# Patient Record
Sex: Female | Born: 1945
Health system: Southern US, Community
[De-identification: ages and names within clinical notes are randomized; demographics above are authoritative.]

## PROBLEM LIST (undated history)

## (undated) DIAGNOSIS — Z8679 Personal history of other diseases of the circulatory system: Secondary | ICD-10-CM

## (undated) DIAGNOSIS — B1910 Unspecified viral hepatitis B without hepatic coma: Secondary | ICD-10-CM

## (undated) DIAGNOSIS — A630 Anogenital (venereal) warts: Secondary | ICD-10-CM

## (undated) DIAGNOSIS — S2239XA Fracture of one rib, unspecified side, initial encounter for closed fracture: Secondary | ICD-10-CM

## (undated) DIAGNOSIS — S82899A Other fracture of unspecified lower leg, initial encounter for closed fracture: Secondary | ICD-10-CM

## (undated) DIAGNOSIS — F32A Depression, unspecified: Secondary | ICD-10-CM

## (undated) DIAGNOSIS — N870 Mild cervical dysplasia: Secondary | ICD-10-CM

## (undated) DIAGNOSIS — F419 Anxiety disorder, unspecified: Secondary | ICD-10-CM

## (undated) DIAGNOSIS — F329 Major depressive disorder, single episode, unspecified: Secondary | ICD-10-CM

## (undated) DIAGNOSIS — M797 Fibromyalgia: Secondary | ICD-10-CM

## (undated) HISTORY — DX: Fracture of one rib, unspecified side, initial encounter for closed fracture: S22.39XA

## (undated) HISTORY — DX: Unspecified viral hepatitis B without hepatic coma: B19.10

## (undated) HISTORY — DX: Major depressive disorder, single episode, unspecified: F32.9

## (undated) HISTORY — DX: Mild cervical dysplasia: N87.0

## (undated) HISTORY — DX: Personal history of other diseases of the circulatory system: Z86.79

## (undated) HISTORY — DX: Anogenital (venereal) warts: A63.0

## (undated) HISTORY — PX: LEEP: SHX91

## (undated) HISTORY — PX: TONSILLECTOMY: SUR1361

## (undated) HISTORY — PX: EXPLORATORY LAPAROTOMY: SUR591

## (undated) HISTORY — DX: Fibromyalgia: M79.7

## (undated) HISTORY — DX: Depression, unspecified: F32.A

## (undated) HISTORY — DX: Other fracture of unspecified lower leg, initial encounter for closed fracture: S82.899A

## (undated) HISTORY — DX: Anxiety disorder, unspecified: F41.9

---

## 1978-12-06 HISTORY — PX: BLADDER SURGERY: SHX569

## 1993-12-06 DIAGNOSIS — N870 Mild cervical dysplasia: Secondary | ICD-10-CM

## 1993-12-06 HISTORY — DX: Mild cervical dysplasia: N87.0

## 1998-09-15 ENCOUNTER — Ambulatory Visit (HOSPITAL_COMMUNITY): Admission: RE | Admit: 1998-09-15 | Discharge: 1998-09-15 | Payer: Self-pay | Admitting: Obstetrics and Gynecology

## 1998-09-16 ENCOUNTER — Other Ambulatory Visit: Admission: RE | Admit: 1998-09-16 | Discharge: 1998-09-16 | Payer: Self-pay | Admitting: Obstetrics and Gynecology

## 1999-09-18 ENCOUNTER — Ambulatory Visit (HOSPITAL_COMMUNITY): Admission: RE | Admit: 1999-09-18 | Discharge: 1999-09-18 | Payer: Self-pay | Admitting: Internal Medicine

## 1999-09-18 ENCOUNTER — Encounter: Payer: Self-pay | Admitting: Internal Medicine

## 1999-11-05 ENCOUNTER — Other Ambulatory Visit: Admission: RE | Admit: 1999-11-05 | Discharge: 1999-11-05 | Payer: Self-pay | Admitting: Obstetrics and Gynecology

## 2000-09-07 ENCOUNTER — Encounter: Admission: RE | Admit: 2000-09-07 | Discharge: 2000-10-10 | Payer: Self-pay | Admitting: Internal Medicine

## 2000-10-07 ENCOUNTER — Encounter: Payer: Self-pay | Admitting: Internal Medicine

## 2000-10-07 ENCOUNTER — Ambulatory Visit (HOSPITAL_COMMUNITY): Admission: RE | Admit: 2000-10-07 | Discharge: 2000-10-07 | Payer: Self-pay | Admitting: Internal Medicine

## 2000-12-13 ENCOUNTER — Other Ambulatory Visit: Admission: RE | Admit: 2000-12-13 | Discharge: 2000-12-13 | Payer: Self-pay | Admitting: Obstetrics and Gynecology

## 2002-01-03 ENCOUNTER — Encounter: Admission: RE | Admit: 2002-01-03 | Discharge: 2002-01-03 | Payer: Self-pay | Admitting: Internal Medicine

## 2002-01-03 ENCOUNTER — Encounter: Payer: Self-pay | Admitting: Internal Medicine

## 2002-01-30 ENCOUNTER — Other Ambulatory Visit: Admission: RE | Admit: 2002-01-30 | Discharge: 2002-01-30 | Payer: Self-pay | Admitting: Obstetrics and Gynecology

## 2002-05-08 ENCOUNTER — Ambulatory Visit (HOSPITAL_COMMUNITY): Admission: RE | Admit: 2002-05-08 | Discharge: 2002-05-08 | Payer: Self-pay | Admitting: Internal Medicine

## 2002-05-08 ENCOUNTER — Encounter: Payer: Self-pay | Admitting: Internal Medicine

## 2002-05-15 ENCOUNTER — Encounter: Admission: RE | Admit: 2002-05-15 | Discharge: 2002-05-15 | Payer: Self-pay | Admitting: Internal Medicine

## 2002-05-15 ENCOUNTER — Encounter: Payer: Self-pay | Admitting: Internal Medicine

## 2002-08-10 ENCOUNTER — Ambulatory Visit (HOSPITAL_COMMUNITY): Admission: RE | Admit: 2002-08-10 | Discharge: 2002-08-10 | Payer: Self-pay | Admitting: Gastroenterology

## 2002-12-06 HISTORY — PX: HYSTEROSCOPY: SHX211

## 2003-02-12 ENCOUNTER — Other Ambulatory Visit: Admission: RE | Admit: 2003-02-12 | Discharge: 2003-02-12 | Payer: Self-pay | Admitting: Obstetrics and Gynecology

## 2003-04-23 ENCOUNTER — Encounter: Payer: Self-pay | Admitting: Internal Medicine

## 2003-04-23 ENCOUNTER — Encounter: Admission: RE | Admit: 2003-04-23 | Discharge: 2003-04-23 | Payer: Self-pay | Admitting: Internal Medicine

## 2003-05-09 ENCOUNTER — Ambulatory Visit (HOSPITAL_COMMUNITY): Admission: RE | Admit: 2003-05-09 | Discharge: 2003-05-09 | Payer: Self-pay | Admitting: Obstetrics and Gynecology

## 2003-05-09 ENCOUNTER — Encounter (INDEPENDENT_AMBULATORY_CARE_PROVIDER_SITE_OTHER): Payer: Self-pay

## 2003-05-17 ENCOUNTER — Encounter: Admission: RE | Admit: 2003-05-17 | Discharge: 2003-05-17 | Payer: Self-pay | Admitting: Occupational Medicine

## 2003-05-17 ENCOUNTER — Encounter: Payer: Self-pay | Admitting: Occupational Medicine

## 2003-06-06 ENCOUNTER — Encounter: Admission: RE | Admit: 2003-06-06 | Discharge: 2003-06-06 | Payer: Self-pay | Admitting: Occupational Medicine

## 2003-06-06 ENCOUNTER — Encounter: Payer: Self-pay | Admitting: Occupational Medicine

## 2003-07-12 ENCOUNTER — Encounter: Payer: Self-pay | Admitting: *Deleted

## 2003-07-12 ENCOUNTER — Encounter: Admission: RE | Admit: 2003-07-12 | Discharge: 2003-07-12 | Payer: Self-pay | Admitting: *Deleted

## 2003-08-16 ENCOUNTER — Ambulatory Visit (HOSPITAL_COMMUNITY): Admission: RE | Admit: 2003-08-16 | Discharge: 2003-08-16 | Payer: Self-pay | Admitting: Obstetrics and Gynecology

## 2003-08-16 ENCOUNTER — Encounter: Payer: Self-pay | Admitting: Obstetrics and Gynecology

## 2004-03-27 ENCOUNTER — Other Ambulatory Visit: Admission: RE | Admit: 2004-03-27 | Discharge: 2004-03-27 | Payer: Self-pay | Admitting: Obstetrics and Gynecology

## 2004-08-18 ENCOUNTER — Ambulatory Visit (HOSPITAL_COMMUNITY): Admission: RE | Admit: 2004-08-18 | Discharge: 2004-08-18 | Payer: Self-pay | Admitting: Obstetrics and Gynecology

## 2004-09-23 ENCOUNTER — Emergency Department (HOSPITAL_COMMUNITY): Admission: EM | Admit: 2004-09-23 | Discharge: 2004-09-23 | Payer: Self-pay | Admitting: Emergency Medicine

## 2005-03-31 ENCOUNTER — Other Ambulatory Visit: Admission: RE | Admit: 2005-03-31 | Discharge: 2005-03-31 | Payer: Self-pay | Admitting: Obstetrics and Gynecology

## 2005-08-18 ENCOUNTER — Ambulatory Visit (HOSPITAL_COMMUNITY): Admission: RE | Admit: 2005-08-18 | Discharge: 2005-08-18 | Payer: Self-pay | Admitting: Obstetrics and Gynecology

## 2006-04-20 ENCOUNTER — Other Ambulatory Visit: Admission: RE | Admit: 2006-04-20 | Discharge: 2006-04-20 | Payer: Self-pay | Admitting: Obstetrics and Gynecology

## 2006-08-23 ENCOUNTER — Ambulatory Visit (HOSPITAL_COMMUNITY): Admission: RE | Admit: 2006-08-23 | Discharge: 2006-08-23 | Payer: Self-pay | Admitting: Obstetrics and Gynecology

## 2006-08-26 ENCOUNTER — Ambulatory Visit (HOSPITAL_COMMUNITY): Admission: RE | Admit: 2006-08-26 | Discharge: 2006-08-26 | Payer: Self-pay | Admitting: Obstetrics and Gynecology

## 2007-07-28 ENCOUNTER — Other Ambulatory Visit: Admission: RE | Admit: 2007-07-28 | Discharge: 2007-07-28 | Payer: Self-pay | Admitting: Obstetrics and Gynecology

## 2007-08-25 ENCOUNTER — Ambulatory Visit (HOSPITAL_COMMUNITY): Admission: RE | Admit: 2007-08-25 | Discharge: 2007-08-25 | Payer: Self-pay | Admitting: Obstetrics and Gynecology

## 2008-09-23 ENCOUNTER — Ambulatory Visit (HOSPITAL_COMMUNITY): Admission: RE | Admit: 2008-09-23 | Discharge: 2008-09-23 | Payer: Self-pay | Admitting: Obstetrics and Gynecology

## 2008-12-25 ENCOUNTER — Other Ambulatory Visit: Admission: RE | Admit: 2008-12-25 | Discharge: 2008-12-25 | Payer: Self-pay | Admitting: Obstetrics and Gynecology

## 2009-03-14 ENCOUNTER — Emergency Department (HOSPITAL_COMMUNITY): Admission: EM | Admit: 2009-03-14 | Discharge: 2009-03-14 | Payer: Self-pay | Admitting: Emergency Medicine

## 2009-05-13 ENCOUNTER — Encounter
Admission: RE | Admit: 2009-05-13 | Discharge: 2009-05-13 | Payer: Self-pay | Admitting: Physical Medicine and Rehabilitation

## 2009-10-22 ENCOUNTER — Ambulatory Visit (HOSPITAL_COMMUNITY): Admission: RE | Admit: 2009-10-22 | Discharge: 2009-10-22 | Payer: Self-pay | Admitting: Obstetrics and Gynecology

## 2010-10-26 ENCOUNTER — Ambulatory Visit (HOSPITAL_COMMUNITY)
Admission: RE | Admit: 2010-10-26 | Discharge: 2010-10-26 | Payer: Self-pay | Source: Home / Self Care | Admitting: Obstetrics and Gynecology

## 2011-04-23 NOTE — Op Note (Signed)
   Anna Lindsey, Anna Lindsey                      ACCOUNT NO.:  [id number]   MEDICAL RECORD NO.:  [id number]                   PATIENT TYPE:  AMB   LOCATION:  ENDO                                 FACILITY:  [facility]   PHYSICIAN:  James L. Malon Kindle., M.D.          DATE OF BIRTH:  [birth year]   DATE OF PROCEDURE:  DATE OF DISCHARGE:                                 OPERATIVE REPORT   PROCEDURE:  Colonoscopy, scope Olympus pediatric video colonoscope.   MEDICATIONS:  Fentanyl 70 mg, Versed 7 mg IV.   INDICATIONS:  Strong family history of colon cancer.   HISTORY:  The patient comes in now for a five year followup colonoscopy. She  had a negative colonoscopy five years ago. She has a strong family history  of colon cancer.   DESCRIPTION OF PROCEDURE:  The procedure was explained to the patient and  the patient's consent was obtained.  The patient was placed  in the left  lateral decubitus position.   The Olympus pediatric scope  was inserted and advanced under direct  visualization. The prep was excellent. Having  reached the cecum without  difficulty using abdominal pressure and position changes, the ileocecal  valve and the appendiceal orifice were seen.   The scope was withdrawn and the cecum, ascending colon, hepatic flexure,  transverse colon, splenic flexure, descending and sigmoid  colon were seen  well. No polyps or other lesions were seen. The scope was withdrawn. The  patient tolerated the procedure well.   ASSESSMENT:  No evidence of colon polyps in this high risk individual.   PLAN:  Will recommend yearly hemoccults and repeat  her colonoscopy in five  years.                                                 James L. Malon Kindle., M.D.    Anna Lindsey  D:  08/10/2002  T:  08/13/2002  Job:  [id number]   cc:   Darius Bump, M.D.

## 2011-04-23 NOTE — Op Note (Signed)
   [name], Anna Lindsey                      ACCOUNT NO.:  [id number]   MEDICAL RECORD NO.:  [id number]                   PATIENT TYPE:  AMB   LOCATION:  SDC                                  FACILITY:  [facility]   PHYSICIAN:  Cynthia P. Romine, M.D.             DATE OF BIRTH:  [birth year]   DATE OF PROCEDURE:  05/09/2003  DATE OF DISCHARGE:                                 OPERATIVE REPORT   PREOPERATIVE DIAGNOSES:  1. Postmenopausal bleeding.  2. Known endometrial polyp.   POSTOPERATIVE DIAGNOSES:  1. Postmenopausal bleeding.  2. Known endometrial polyp.  3. Pathology pending.   PROCEDURES:  1. Hysteroscopy.  2. Dilatation and curettage.  3. Removal of endometrial polyp.   SURGEON:  Cynthia P. Romine, M.D.   ANESTHESIA:  Modified anesthesia care and paracervical block.   ESTIMATED BLOOD LOSS:  Minimal.   COMPLICATIONS:  None.   PROCEDURE:  The patient was taken to the operating room, where after the  induction of IV sedation by anesthesia was placed in the dorsal lithotomy  position and prepped and draped in the usual fashion.  The cervix was  grasped on the anterior lip with a single-tooth tenaculum.  The uterus was  sounded to 8 cm.  The cervix was dilated to a #31 Pratt.  Polyp forceps were  introduced and a large approximately  10 cm polyp was removed intact with  polyp forceps.  The resectoscope was then introduced, sorbitol was used as a  distention medium.  The cavity was inspected and other than that polyp,  which had been there previously, was absolutely clean.  The tubal ostia were  noted.  Photographic documentation was taken.  The hysteroscope was removed,  sharp curettage was carried out with a scant specimen and sent to pathology,  and the procedure was terminated.  The instruments were removed from the  patient's vagina and the patient was taken to the recovery room in  satisfactory condition.                                                Cynthia P.  Romine, M.D.    CPR/MEDQ  D:  05/09/2003  T:  05/09/2003  Job:  [id number]

## 2011-08-10 ENCOUNTER — Ambulatory Visit (HOSPITAL_COMMUNITY): Payer: Self-pay | Admitting: Psychology

## 2011-08-17 ENCOUNTER — Ambulatory Visit (HOSPITAL_COMMUNITY): Payer: Self-pay | Admitting: Psychology

## 2011-09-03 ENCOUNTER — Ambulatory Visit (HOSPITAL_COMMUNITY): Payer: 59 | Admitting: Psychology

## 2011-09-03 DIAGNOSIS — F3289 Other specified depressive episodes: Secondary | ICD-10-CM

## 2011-09-03 DIAGNOSIS — F329 Major depressive disorder, single episode, unspecified: Secondary | ICD-10-CM

## 2011-09-21 ENCOUNTER — Encounter (HOSPITAL_COMMUNITY): Payer: 59 | Admitting: Psychology

## 2011-09-21 DIAGNOSIS — F331 Major depressive disorder, recurrent, moderate: Secondary | ICD-10-CM

## 2011-10-07 ENCOUNTER — Encounter (HOSPITAL_COMMUNITY): Payer: 59 | Admitting: Psychology

## 2011-10-07 ENCOUNTER — Encounter (HOSPITAL_COMMUNITY): Payer: Self-pay | Admitting: Psychology

## 2011-10-07 DIAGNOSIS — F331 Major depressive disorder, recurrent, moderate: Secondary | ICD-10-CM

## 2011-10-07 NOTE — Progress Notes (Signed)
Anna Lindsey is experiencing extreme fatigue and finds it hard to know if it is from physical work at work or from emotional stress.  New person at work has started, but looks to [place] to train her.  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 In addition to caring for her mother (she has not found time to call and request home health visit), she now is also giving fluids by clysis to her mother's cat 3 times a week.  He is ill and old, but she is not yet ready to consider euthanasia.  Anna Lindsey appears tired, speaks in a normal tone and volume and with good insight and judgment.  She is able to acknowledge her current situation is temporary, so although she feels "trapped" we were able to identify a good prospect of relief within several months.  Major intervention was reframing and accepting the present situation.

## 2011-10-07 NOTE — Patient Instructions (Signed)
Return in 2 weeks

## 2011-12-10 ENCOUNTER — Ambulatory Visit (HOSPITAL_COMMUNITY): Payer: 59 | Admitting: Psychology

## 2011-12-10 DIAGNOSIS — IMO0002 Reserved for concepts with insufficient information to code with codable children: Secondary | ICD-10-CM

## 2011-12-10 NOTE — Progress Notes (Signed)
   THERAPIST PROGRESS NOTE  Session Time: 1600 - 1700  Participation Level: Active  Behavioral Response: Well GroomedAlertDepressed  Type of Therapy: Individual Therapy  Treatment Goals addressed: Coping  Interventions: Supportive and Reframing  Summary: Anna Lindsey is a 66 y.o. female who presents with extreme tiredness related to working  3/4 time and having care taking responsibilities for her mother and to a degree for her husband and daughter.  The holidays were difficult, with work requirements and her mother needing hospitalization for a URI on Christmas Day.  We focused on some of the current demands on her time while looking for ways to relieve her sense that everything is up to her.  She has spoken to her husband about learning to drive (he has never tried to get a license and is 65 years old, had poor eyesight as a child).  Her daughter is remaining clean and sober and may complete nursing school this semester. Anna Lindsey has cut down to 6 hrs/day at work and the person who was hired to be the supervisor needs less help from her now.  She spends each night at her mother's house and has a  Public affairs consultant during the day, but her mother is improving and may not need that continuously. Anna Lindsey is working on her attitude that she needs to be sure everybody else is ok, and give herself a break at times.  Overall, she reports she is less angry now and is focused on taking care of her own needs and well being. Affect is broad, mood "tired", thinking logical and goal directed and she can sleep at night.   Suicidal/Homicidal: Nowithout intent/plan  Therapist Response:   shared care-giving tips, validated her decisions thus far and supported her self care efforts.  Suggested when she finds herself getting irritable that she step back and ask herself what is bothering her and is it something she can do something about or not.   Plan: Return again in 4 weeks.  Diagnosis: Axis I: Major Depression,  Recurrent moderate    Axis II: No diagnosis    Elease Swarm, RN 12/10/2011

## 2011-12-11 ENCOUNTER — Emergency Department (HOSPITAL_COMMUNITY): Payer: No Typology Code available for payment source

## 2011-12-11 ENCOUNTER — Emergency Department (HOSPITAL_COMMUNITY)
Admission: EM | Admit: 2011-12-11 | Discharge: 2011-12-12 | Disposition: A | Discharge status: Home / Self Care | Payer: No Typology Code available for payment source | Attending: Emergency Medicine | Admitting: Emergency Medicine

## 2011-12-11 ENCOUNTER — Encounter: Payer: Self-pay | Admitting: *Deleted

## 2011-12-11 DIAGNOSIS — R609 Edema, unspecified: Secondary | ICD-10-CM | POA: Insufficient documentation

## 2011-12-11 DIAGNOSIS — Z79899 Other long term (current) drug therapy: Secondary | ICD-10-CM | POA: Insufficient documentation

## 2011-12-11 DIAGNOSIS — S92309A Fracture of unspecified metatarsal bone(s), unspecified foot, initial encounter for closed fracture: Secondary | ICD-10-CM

## 2011-12-11 DIAGNOSIS — M79609 Pain in unspecified limb: Secondary | ICD-10-CM | POA: Insufficient documentation

## 2011-12-11 DIAGNOSIS — M7989 Other specified soft tissue disorders: Secondary | ICD-10-CM | POA: Insufficient documentation

## 2011-12-11 DIAGNOSIS — S92009A Unspecified fracture of unspecified calcaneus, initial encounter for closed fracture: Secondary | ICD-10-CM

## 2011-12-11 DIAGNOSIS — S9030XA Contusion of unspecified foot, initial encounter: Secondary | ICD-10-CM | POA: Insufficient documentation

## 2011-12-11 MED ORDER — HYDROCODONE-ACETAMINOPHEN 5-325 MG PO TABS
2.0000 | ORAL_TABLET | Freq: Once | ORAL | Status: AC
  Administered 2011-12-11: 2 via ORAL
  Filled 2011-12-11: qty 2
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
## 2011-12-12 ENCOUNTER — Emergency Department (HOSPITAL_COMMUNITY): Payer: No Typology Code available for payment source

## 2011-12-12 MED ORDER — OXYCODONE-ACETAMINOPHEN 5-325 MG PO TABS: 1.0000 | ORAL_TABLET | ORAL | Status: AC | PRN

## 2011-12-12 MED ORDER — ONDANSETRON HCL 4 MG PO TABS: 4.0000 mg | ORAL_TABLET | Freq: Four times a day (QID) | ORAL | Status: AC

## 2012-01-07 ENCOUNTER — Ambulatory Visit (HOSPITAL_COMMUNITY): Payer: 59 | Admitting: Psychology

## 2012-04-19 ENCOUNTER — Other Ambulatory Visit: Payer: Self-pay | Admitting: Obstetrics and Gynecology

## 2012-04-19 DIAGNOSIS — Z1231 Encounter for screening mammogram for malignant neoplasm of breast: Secondary | ICD-10-CM

## 2012-04-25 ENCOUNTER — Ambulatory Visit
Admission: RE | Admit: 2012-04-25 | Discharge: 2012-04-25 | Disposition: A | Discharge status: Home / Self Care | Payer: Medicare Other | Source: Ambulatory Visit | Attending: Obstetrics and Gynecology | Admitting: Obstetrics and Gynecology

## 2012-04-25 DIAGNOSIS — Z1231 Encounter for screening mammogram for malignant neoplasm of breast: Secondary | ICD-10-CM

## 2012-07-12 ENCOUNTER — Ambulatory Visit (INDEPENDENT_AMBULATORY_CARE_PROVIDER_SITE_OTHER): Payer: Medicare Other | Admitting: Family Medicine

## 2012-07-12 VITALS — BP 130/75 | HR 87 | Temp 97.7°F | Resp 16 | Ht 61.0 in | Wt 111.0 lb

## 2012-07-12 DIAGNOSIS — R059 Cough, unspecified: Secondary | ICD-10-CM

## 2012-07-12 DIAGNOSIS — W5501XA Bitten by cat, initial encounter: Secondary | ICD-10-CM

## 2012-07-12 DIAGNOSIS — R05 Cough: Secondary | ICD-10-CM

## 2012-07-12 MED ORDER — AMOXICILLIN-POT CLAVULANATE 875-125 MG PO TABS: 1.0000 | ORAL_TABLET | Freq: Two times a day (BID) | ORAL | Status: AC

## 2012-07-12 NOTE — Progress Notes (Signed)
[facility] [address] [place] [address] [phone]  Date:  07/12/2012   Name:  Anna Lindsey   DOB:  1946/04/11   MRN:  [id number]  PCP:  No primary provider on file.    Chief Complaint: wound forearm   History of Present Illness:  Anna Lindsey is a 66 y.o. very pleasant female patient who presents with the following:  She was bitten by her elderly cat last night- bitten on her right arm. She was bathing the cat- it became upset and bit her.  The cat is up to date on it's immunizations and is an indoor pet.  The bite turned red and swollen over-night, and is painful.  She also has noted a mild cough for about one week.  She has not had any fever or chills, and is not coughing anything up.    Tetanus shot in 2007  There is no problem list on file for this patient.   No past medical history on file.  No past surgical history on file.  History  Substance Use Topics  . Smoking status: Never Smoker   . Smokeless tobacco: Not on file  . Alcohol Use: No    No family history on file.  No Known Allergies  Medication list has been reviewed and updated.  Current Outpatient Prescriptions on File Prior to Visit  Medication Sig Dispense Refill  . citalopram (CELEXA) 20 MG tablet Take 20 mg by mouth at bedtime.        Marland Kitchen estradiol (ESTRACE) 1 MG tablet Take 1 mg by mouth daily.        . medroxyPROGESTERone (PROVERA) 5 MG tablet Take 5 mg by mouth daily. TAKE ON DAYS 1-13       . nortriptyline (PAMELOR) 75 MG capsule Take 75 mg by mouth at bedtime.          Review of Systems:  As per HPI- otherwise negative.   Physical Examination: Filed Vitals:   07/12/12 0954  BP: 130/75  Pulse: 87  Temp: 97.7 F (36.5 C)  Resp: 16   Filed Vitals:   07/12/12 0954  Height: 5\' 1"  (1.549 m)  Weight: 111 lb (50.349 kg)   Body mass index is 20.97 kg/(m^2). Ideal Body Weight: Weight in (lb) to have BMI = 25: 132   GEN: WDWN, NAD, Non-toxic, A & O x  3 HEENT: Atraumatic, Normocephalic. Neck supple. No masses, No LAD.  Oropharynx wnl, PEERL Ears and Nose: No external deformity. CV: RRR, No M/G/R. No JVD. No thrill. No extra heart sounds. PULM: CTA B, no wheezes, crackles, rhonchi. No retractions. No resp. distress. No accessory muscle use. ABD: S, NT, ND, +BS. No rebound. No HSM. EXTR: No c/c/e NEURO Normal gait.  PSYCH: Normally interactive. Conversant. Not depressed or anxious appearing.  Calm demeanor.  Right forearm: there are bite marks on the ventral and dorsal sides of her arm with some redness and mild swelling adjacent.  She has full ROM and strength of her arm.  No fluctuance, no pus  Assessment and Plan: 1. Cat bite  amoxicillin-clavulanate (AUGMENTIN) 875-125 MG per tablet  2. Cough     Bite report faxed to [facility]  Augmentin for cat bite.  Cough is likely viral, but augmentin is a good agent for bronchitis as well.  Let me know if bite not improving in the next 48 hours- Sooner if worse.     Abbe Amsterdam, MD

## 2012-07-13 ENCOUNTER — Ambulatory Visit (INDEPENDENT_AMBULATORY_CARE_PROVIDER_SITE_OTHER): Payer: Medicare Other | Admitting: Family Medicine

## 2012-07-13 ENCOUNTER — Ambulatory Visit: Payer: Medicare Other

## 2012-07-13 VITALS — BP 134/71 | HR 97 | Temp 97.8°F | Resp 18 | Ht 60.5 in | Wt 112.0 lb

## 2012-07-13 DIAGNOSIS — W5501XA Bitten by cat, initial encounter: Secondary | ICD-10-CM

## 2012-07-13 DIAGNOSIS — R059 Cough, unspecified: Secondary | ICD-10-CM

## 2012-07-13 DIAGNOSIS — R05 Cough: Secondary | ICD-10-CM

## 2012-07-13 DIAGNOSIS — IMO0002 Reserved for concepts with insufficient information to code with codable children: Secondary | ICD-10-CM

## 2012-07-13 LAB — POCT CBC
Lymph, poc: 2.2 (ref 0.6–3.4)
MCH, POC: 29.6 pg (ref 27–31.2)
MCHC: 30.2 g/dL — AB (ref 31.8–35.4)
MID (cbc): 0.6 (ref 0–0.9)
MPV: 7.8 fL (ref 0–99.8)
POC LYMPH PERCENT: 25.3 %L (ref 10–50)
POC MID %: 6.6 %M (ref 0–12)
Platelet Count, POC: 465 10*3/uL — AB (ref 142–424)
WBC: 8.6 10*3/uL (ref 4.6–10.2)

## 2012-07-13 MED ORDER — CEFTRIAXONE SODIUM 1 G IJ SOLR
1.0000 g | INTRAMUSCULAR | Status: DC
  Administered 2012-07-13: 1 g via INTRAMUSCULAR

## 2012-07-13 NOTE — Progress Notes (Signed)
[facility] [address] [place] [address] [phone]  Date:  07/13/2012   Name:  Anna Lindsey   DOB:  11-15-1946   MRN:  [id number]  PCP:  No primary provider on file.    Chief Complaint: Animal Bite   History of Present Illness:  Anna Lindsey is a 66 y.o. very pleasant female patient who presents with the following:  She was here yesterday to evaluate a cat bite on her right arm- she had taken 3 doses of augmentin and is not better yet.  In fact, the redness is spreading and the pain is worse.  No fever.  She was concerned and came in for recheck.  She is generally very healthy  There is no problem list on file for this patient.   No past medical history on file.  No past surgical history on file.  History  Substance Use Topics  . Smoking status: Never Smoker   . Smokeless tobacco: Not on file  . Alcohol Use: No    No family history on file.  No Known Allergies  Medication list has been reviewed and updated.  Current Outpatient Prescriptions on File Prior to Visit  Medication Sig Dispense Refill  . amoxicillin-clavulanate (AUGMENTIN) 875-125 MG per tablet Take 1 tablet by mouth 2 (two) times daily.  20 tablet  0  . citalopram (CELEXA) 20 MG tablet Take 20 mg by mouth at bedtime.        Marland Kitchen estradiol (ESTRACE) 1 MG tablet Take 1 mg by mouth daily.        . medroxyPROGESTERone (PROVERA) 5 MG tablet Take 5 mg by mouth daily. TAKE ON DAYS 1-13       . nortriptyline (PAMELOR) 75 MG capsule Take 75 mg by mouth at bedtime.          Review of Systems:  As per HPI- otherwise negative. She feels ok except for her arm  Physical Examination: Filed Vitals:   07/13/12 1536  BP: 134/71  Pulse: 97  Temp: 97.8 F (36.6 C)  Resp: 18   Filed Vitals:   07/13/12 1536  Height: 5' 0.5" (1.537 m)  Weight: 112 lb (50.803 kg)   Body mass index is 21.51 kg/(m^2). Ideal Body Weight: Weight in (lb) to have BMI = 25: 129.9   GEN: WDWN, NAD,  Non-toxic, A & O x 3, looks well HEENT: Atraumatic, Normocephalic. Neck supple. No masses, No LAD. Ears and Nose: No external deformity. CV: RRR, No M/G/R. No JVD. No thrill. No extra heart sounds. PULM: CTA B, no wheezes, crackles, rhonchi. No retractions. EXTR: No c/c/e. Right forearm- again noted bite marks on the ventral and dorsal sides, with slight warmth and redness.  Mild swelling.  The redness is more widespread than yesterday.  No purulence or fluctuance.   NEURO Normal gait.  PSYCH: Normally interactive. Conversant. Not depressed or anxious appearing.  Calm demeanor.   [facility] reading (PRIMARY) by  Dr. Patsy Lager.  Right forearm:  Negative  Results for orders placed in visit on 07/13/12  POCT CBC      Component Value Range   WBC 8.6  4.6 - 10.2 K/uL   Lymph, poc 2.2  0.6 - 3.4   POC LYMPH PERCENT 25.3  10 - 50 %L   MID (cbc) 0.6  0 - 0.9   POC MID % 6.6  0 - 12 %M   POC Granulocyte 5.9  2 - 6.9   Granulocyte percent  68.1  37 - 80 %G   RBC 4.25  4.04 - 5.48 M/uL   Hemoglobin 12.6  12.2 - 16.2 g/dL   HCT, POC 16.1  09.6 - 47.9 %   MCV 98.1 (*) 80 - 97 fL   MCH, POC 29.6  27 - 31.2 pg   MCHC 30.2 (*) 31.8 - 35.4 g/dL   RDW, POC 045     Platelet Count, POC 465 (*) 142 - 424 K/uL   MPV 7.8  0 - 99.8 fL    Assessment and Plan: 1. Cat bite  POCT CBC, DG Forearm Right, cefTRIAXone (ROCEPHIN) injection 1 g   Cat bite- infected.  Not yet responding to treatment.  Add a gram of rocephin today, continue to use augmentin.  Come back tomorrow if continuing to get worse.  Gave her culture tube in case she develops any pus.  Call if not getting better or if any other concerns!  Abbe Amsterdam, MD

## 2012-07-14 ENCOUNTER — Telehealth: Payer: Self-pay

## 2012-07-14 NOTE — Telephone Encounter (Signed)
Saw Dr. Patsy Lager yesterday and had a cat bite from her own animal.  Animal control in [place] wants to take the cat.  Anna Lindsey (pt's middle name) wants to know if there are any Rabies shots that she can take that would mitigate animal control taking possession of her cat.   [phone].

## 2012-07-15 NOTE — Telephone Encounter (Signed)
She can certainly try to show immunizations to animal control, but unsure what their regulations are.

## 2012-07-15 NOTE — Telephone Encounter (Signed)
As long as her Vet can prove that her cat is UTD on vaccines, then can't the vet monitor cat x 1 week instead of animal control?  Otherwise they would have to take animal.

## 2012-07-15 NOTE — Telephone Encounter (Signed)
Called patient, no answer or machine.

## 2012-07-16 NOTE — Telephone Encounter (Signed)
Spoke with patient, cat bite is healing well.  Unfortunately her cat passed away the following day and since she was overdue for her rabies shot, animal control took the cat for testing in [place].  If testing is negative, then they should be able to get the body back to [place]...  So sad.  Patient thanks [place] for checking on her.

## 2012-07-26 ENCOUNTER — Ambulatory Visit (HOSPITAL_COMMUNITY): Payer: Medicare Other | Admitting: Psychiatry

## 2013-01-02 ENCOUNTER — Encounter (HOSPITAL_COMMUNITY): Payer: Self-pay | Admitting: Licensed Clinical Social Worker

## 2013-01-02 ENCOUNTER — Ambulatory Visit (INDEPENDENT_AMBULATORY_CARE_PROVIDER_SITE_OTHER): Payer: Medicare Other | Admitting: Licensed Clinical Social Worker

## 2013-01-02 DIAGNOSIS — F329 Major depressive disorder, single episode, unspecified: Secondary | ICD-10-CM
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
## 2013-01-16 ENCOUNTER — Ambulatory Visit (INDEPENDENT_AMBULATORY_CARE_PROVIDER_SITE_OTHER): Payer: Medicare Other | Admitting: Licensed Clinical Social Worker

## 2013-01-16 DIAGNOSIS — F3289 Other specified depressive episodes: Secondary | ICD-10-CM

## 2013-01-16 DIAGNOSIS — F32A Depression, unspecified: Secondary | ICD-10-CM | POA: Insufficient documentation

## 2013-01-16 DIAGNOSIS — F329 Major depressive disorder, single episode, unspecified: Secondary | ICD-10-CM

## 2013-01-16 NOTE — Progress Notes (Signed)
   THERAPIST PROGRESS NOTE  Session Time: 4:00pm-4:50pm  Participation Level: Active  Behavioral Response: Well GroomedAlertAnxious and Depressed  Type of Therapy: Individual Therapy  Treatment Goals addressed: Coping  Interventions: CBT, Motivational Interviewing, Strength-based, Supportive and Reframing  Summary: Anna Lindsey is a 67 y.o. female who presents with depressed mood and anxious affect. She reports no change in her symptoms since last session. She is unmotivated to do things which need to be done and puts them off. She is tired of taking care of her mother and disappointed that she has to take care of her husband on occasion. She endorses a general feeling of disappointment in her life. She has regrets about choices and is questioning her marriage. She becomes angry with her mother when she does not respond to her and she is unsure if her mother has some type of dementia.    Suicidal/Homicidal: Nowithout intent/plan  Therapist Response: Assessed patients current functioning and reviewed progress. Reviewed coping strategies. Assessed patients safety and assisted in identifying protective factors.  Reviewed crisis plan with patient. Assisted patient with the expression of her feelings of frustration and anger. Used CBT to assist patient with the identification of negative distortions and irrational thoughts. Encouraged patient to verbalize alternative and factual responses which challenge thought distortions. Explored the consequences of constant care taking and cumulative stress. Used DBT to practice mindfulness, review distraction list and improve distress tolerance skills. Reviewed patients self care plan. Assessed  progress related to self care. Patient's self care is good. Recommend proper diet, regular exercise, socialization and recreation. Continue to journal daily.   Plan: Return again in one weeks.  Diagnosis: Axis I: Depressive Disorder NOS    Axis II: No  diagnosis    Mylinda Brook, LCSW 01/16/2013

## 2013-01-17 ENCOUNTER — Ambulatory Visit (INDEPENDENT_AMBULATORY_CARE_PROVIDER_SITE_OTHER): Payer: Medicare Other | Admitting: Psychiatry

## 2013-01-17 ENCOUNTER — Encounter (HOSPITAL_COMMUNITY): Payer: Self-pay | Admitting: Psychiatry

## 2013-01-17 VITALS — BP 128/78 | HR 96 | Wt 118.6 lb

## 2013-01-17 DIAGNOSIS — F329 Major depressive disorder, single episode, unspecified: Secondary | ICD-10-CM

## 2013-01-17 MED ORDER — NORTRIPTYLINE HCL 25 MG PO CAPS: 25.0000 mg | ORAL_CAPSULE | Freq: Every day | ORAL | Status: DC
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
## 2013-01-22 ENCOUNTER — Ambulatory Visit (HOSPITAL_COMMUNITY): Payer: Medicare Other | Admitting: Licensed Clinical Social Worker

## 2013-01-26 ENCOUNTER — Ambulatory Visit (INDEPENDENT_AMBULATORY_CARE_PROVIDER_SITE_OTHER): Payer: Medicare Other | Admitting: Licensed Clinical Social Worker

## 2013-01-26 DIAGNOSIS — F329 Major depressive disorder, single episode, unspecified: Secondary | ICD-10-CM

## 2013-01-26 NOTE — Progress Notes (Signed)
   THERAPIST PROGRESS NOTE  Session Time: 10:30am-11:20am  Participation Level: Active  Behavioral Response: Well GroomedAlertAnxious and Depressed  Type of Therapy: Individual Therapy  Treatment Goals addressed: Coping  Interventions: CBT, Solution Focused, Strength-based, Supportive and Reframing  Summary: Anna Lindsey is a 67 y.o. female who presents with depressed mood and anxious affect. She is pleased to report making some progress on goals set during her last session. She had decreased her visits to her mother's house and has called her instead. She does not endorse feeling guilty over this. She is beginning to realize that her expectations that her mother will "be excited" about something in her life is unrealistic given her age of 57. Patient is able to identify that much of this is about her own fear that she will end up like her mother. She fears her cognition will be compromised as she ages. She is trying to focus on taking care of herself and making changes to her own behavior rather than hoping that her husband or mother will make changes. She expresses frustration with her husband OCD and how this makes them very late. She has decided to stop making plans with him because of this. Her sleep and appetite are wnl.    Suicidal/Homicidal: Nowithout intent/plan  Therapist Response: Assessed patients current functioning and reviewed progress. Reviewed coping strategies. Assessed patients safety and assisted in identifying protective factors.  Reviewed crisis plan with patient. Assisted patient with the expression of her feelings of frustration and fear. Used CBT to assist patient with the identification of negative distortions and irrational thoughts. Encouraged patient to verbalize alternative and factual responses which challenge thought distortions. Reviewed expression of healthy boundaries and assertive communication. Reviewed setting realistic expectations in her life for others in  order to avoid disappointment and frustration. Reviewed patients self care plan. Assessed  progress related to self care. Patient's self care is good. Recommend proper diet, regular exercise, socialization and recreation.   Plan: Return again in one to two weeks.  Diagnosis: Axis I: Depressive Disorder NOS    Axis II: No diagnosis    Abie Killian, LCSW 01/26/2013

## 2013-01-30 ENCOUNTER — Ambulatory Visit (INDEPENDENT_AMBULATORY_CARE_PROVIDER_SITE_OTHER): Payer: Medicare Other | Admitting: Licensed Clinical Social Worker

## 2013-01-30 DIAGNOSIS — F329 Major depressive disorder, single episode, unspecified: Secondary | ICD-10-CM

## 2013-01-30 NOTE — Progress Notes (Signed)
   THERAPIST PROGRESS NOTE  Session Time: 4:00pm-4:50pm  Participation Level: Active  Behavioral Response: Well GroomedAlertAnxious  Type of Therapy: Individual Therapy  Treatment Goals addressed: Coping  Interventions: CBT, Strength-based, Supportive and Reframing  Summary: Anna Lindsey is a 67 y.o. female who presents with depressed mood and anxious affect. She feels like she has turned the corner and endorses hopefulness about her life and ability to make changes. She has become more verbal with her husband, communicating her feelings and needs. She is working towards improved life balance regarding care taking of her mother. She explores her decision making processes and questions the men she chooses in her life. She is able to recognize her pattern of ignoring warning signs in relationships due to her hopefulness. Her sleep is improving. Her appetite is wnl.    Suicidal/Homicidal: Nowithout intent/plan  Therapist Response: Assessed patients current functioning and reviewed progress. Reviewed coping strategies. Assessed patients safety and assisted in identifying protective factors.  Reviewed crisis plan with patient. Assisted patient with the expression of her feelings of frustration. Used CBT to assist patient with the identification of negative distortions and irrational thoughts. Encouraged patient to verbalize alternative and factual responses which challenge thought distortions. Reviewed setting healthy boundaries and assertive communication. Reviewed patients self care plan. Assessed  progress related to self care. Patient's self care is good. Recommend proper diet, regular exercise, socialization and recreation.   Plan: Return again in one weeks.  Diagnosis: Axis I: Depressive Disorder NOS    Axis II: No diagnosis    Makayla Confer, LCSW 01/30/2013

## 2013-02-06 ENCOUNTER — Ambulatory Visit (HOSPITAL_COMMUNITY): Payer: Medicare Other | Admitting: Licensed Clinical Social Worker

## 2013-02-07 ENCOUNTER — Ambulatory Visit (HOSPITAL_COMMUNITY): Payer: Self-pay | Admitting: Psychiatry

## 2013-02-07 ENCOUNTER — Ambulatory Visit (INDEPENDENT_AMBULATORY_CARE_PROVIDER_SITE_OTHER): Payer: Medicare Other | Admitting: Licensed Clinical Social Worker

## 2013-02-07 DIAGNOSIS — F329 Major depressive disorder, single episode, unspecified: Secondary | ICD-10-CM

## 2013-02-07 DIAGNOSIS — F3289 Other specified depressive episodes: Secondary | ICD-10-CM

## 2013-02-07 NOTE — Progress Notes (Signed)
   THERAPIST PROGRESS NOTE  Session Time: 8:30am-9:20am  Participation Level: Active  Behavioral Response: Well GroomedAlertAnxious and Depressed  Type of Therapy: Individual Therapy  Treatment Goals addressed: Coping  Interventions: CBT, Motivational Interviewing, Solution Focused, Strength-based, Supportive and Reframing  Summary: Anna Lindsey is a 67 y.o. female who presents with depressed mood and anxious affect. She reports ongoing frustration with her husband and her mother. She is trying to focus on letting go of her desire and need for control. She feels that she needs to accept that others do not respond the way she does in order to avoid disappointment. She became frustrated with her mother because she refuses to bathe and patient is trying different methods to motivate her. She does still become agitated with her mother, but she is trying to reduce this. She processes her ongoing frustration with her husband, his lack of motivation and accountability in their marriage. She questions why she married him if he is not willing to support her. Her sleep and appetite are wnl.   Suicidal/Homicidal: Nowithout intent/plan  Therapist Response: Assessed patients current functioning and reviewed progress. Reviewed coping strategies. Assessed patients safety and assisted in identifying protective factors.  Reviewed crisis plan with patient. Assisted patient with the expression of frustration and anxiety. Reviewed patients self care plan. Assessed progress related to self care. Patients self care is good. Recommend daily exercise, increased socialization and recreation. Reviewed healthy boundaries and assertive communication. Used CBT to assist patient with the identification of negative distortions and irrational thoughts. Encouraged patient to verbalize alternative and factual responses which challenge thought distortions. Processed and normalized patients grief reaction.   Plan: Return again in  two weeks.  Diagnosis: Axis I: Depressive Disorder NOS    Axis II: No diagnosis    NORDEN,KRISTIN, LCSW 02/07/2013

## 2013-02-08 ENCOUNTER — Encounter (HOSPITAL_COMMUNITY): Payer: Self-pay | Admitting: Psychiatry

## 2013-02-08 ENCOUNTER — Ambulatory Visit (INDEPENDENT_AMBULATORY_CARE_PROVIDER_SITE_OTHER): Payer: Medicare Other | Admitting: Psychiatry

## 2013-02-08 VITALS — BP 146/76 | HR 93 | Wt 118.6 lb

## 2013-02-08 DIAGNOSIS — F329 Major depressive disorder, single episode, unspecified: Secondary | ICD-10-CM

## 2013-02-08 MED ORDER — NORTRIPTYLINE HCL 25 MG PO CAPS: 25.0000 mg | ORAL_CAPSULE | Freq: Every day | ORAL | Status: DC

## 2013-02-08 MED ORDER — CITALOPRAM HYDROBROMIDE 20 MG PO TABS: 20.0000 mg | ORAL_TABLET | Freq: Every day | ORAL | Status: DC
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
## 2013-02-13 ENCOUNTER — Ambulatory Visit (HOSPITAL_COMMUNITY): Payer: Medicare Other | Admitting: Licensed Clinical Social Worker

## 2013-02-21 ENCOUNTER — Telehealth (HOSPITAL_COMMUNITY): Payer: Self-pay | Admitting: *Deleted

## 2013-02-21 DIAGNOSIS — F329 Major depressive disorder, single episode, unspecified: Secondary | ICD-10-CM

## 2013-02-22 MED ORDER — NORTRIPTYLINE HCL 25 MG PO CAPS: 25.0000 mg | ORAL_CAPSULE | Freq: Every day | ORAL | Status: DC

## 2013-02-22 NOTE — Telephone Encounter (Signed)
Per Dr.Arfeen, may give enough Amitriptyline 25 mg for patient to take with Amitriptyline 50 mg (that she already has) to equal a 75 mg dose at bedtime.

## 2013-02-27 ENCOUNTER — Ambulatory Visit (INDEPENDENT_AMBULATORY_CARE_PROVIDER_SITE_OTHER): Payer: Medicare Other | Admitting: Licensed Clinical Social Worker

## 2013-02-27 DIAGNOSIS — F329 Major depressive disorder, single episode, unspecified: Secondary | ICD-10-CM

## 2013-02-27 NOTE — Progress Notes (Signed)
   THERAPIST PROGRESS NOTE  Session Time: 1:00pm-1:50pm  Participation Level: Active  Behavioral Response: Well GroomedAlertDepressed  Type of Therapy: Individual Therapy  Treatment Goals addressed: Coping  Interventions: CBT, DBT, Solution Focused, Strength-based, Supportive and Reframing  Summary: Anna Lindsey is a 67 y.o. female who presents with depressed mood and anxious affect. She reports improvement in her degree of depression and anxiety. She continues to feel agitated with her mother and her lack of response towards patient. Patient wants her mother to acknowledge her sacrifice and to thank her for all she does. She remains confused by her mothers lack of spontaneous response and conversation. She does feel some improvement in her marriage and is pleased that her husband is trying to help more around the home. She is trying to adjust her expectations of her mother, but finds this difficult because she has anger she wants to express, but feels that it is pointless considering her mothers age. She is sleeping and eating well.   Suicidal/Homicidal: Nowithout intent/plan  Therapist Response: Assessed patients current functioning and reviewed progress. Reviewed coping strategies. Assessed patients safety and assisted in identifying protective factors.  Reviewed crisis plan with patient. Assisted patient with the expression of frustration. Reviewed patients self care plan. Assessed progress related to self care. Patients self care is good. Recommend daily exercise, increased socialization and recreation. Reviewed healthy boundaries and assertive communication. Used CBT to assist patient with the identification of negative distortions and irrational thoughts. Encouraged patient to verbalize alternative and factual responses which challenge thought distortions. Used DBT to practice mindfulness, review distraction list and improve distress tolerance skills. Patient feels she is making progress  and wants to return if needed, but will not make a follow up appointment at this time.   Plan: Return again when needed.   Diagnosis: Axis I: Depressive Disorder NOS    Axis II: No diagnosis    Sebastien Jackson, LCSW 02/27/2013

## 2013-03-28 ENCOUNTER — Ambulatory Visit (HOSPITAL_COMMUNITY): Payer: Self-pay | Admitting: Psychiatry

## 2013-03-28 ENCOUNTER — Ambulatory Visit (INDEPENDENT_AMBULATORY_CARE_PROVIDER_SITE_OTHER): Payer: Medicare Other | Admitting: Psychiatry

## 2013-03-28 ENCOUNTER — Encounter (HOSPITAL_COMMUNITY): Payer: Self-pay | Admitting: Psychiatry

## 2013-03-28 VITALS — BP 126/72 | HR 86 | Ht 61.5 in | Wt 116.0 lb

## 2013-03-28 DIAGNOSIS — F329 Major depressive disorder, single episode, unspecified: Secondary | ICD-10-CM

## 2013-03-28 MED ORDER — NORTRIPTYLINE HCL 75 MG PO CAPS: 75.0000 mg | ORAL_CAPSULE | Freq: Every day | ORAL | Status: DC

## 2013-03-28 MED ORDER — NORTRIPTYLINE HCL 25 MG PO CAPS: 25.0000 mg | ORAL_CAPSULE | Freq: Every day | ORAL | Status: DC
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
## 2013-04-16 ENCOUNTER — Other Ambulatory Visit (HOSPITAL_COMMUNITY): Payer: Self-pay | Admitting: Psychiatry

## 2013-04-17 ENCOUNTER — Telehealth (HOSPITAL_COMMUNITY): Payer: Self-pay | Admitting: Psychiatry

## 2013-04-17 ENCOUNTER — Other Ambulatory Visit (HOSPITAL_COMMUNITY): Payer: Self-pay | Admitting: Psychiatry

## 2013-04-17 NOTE — Telephone Encounter (Signed)
Patient complaining of hives with Lamictal.  Recommend to stop and if does not resolve call us back.

## 2013-04-17 NOTE — Telephone Encounter (Signed)
Given script on 03/28/13 with 2 refills

## 2013-04-19 ENCOUNTER — Encounter: Payer: Self-pay | Admitting: Obstetrics and Gynecology

## 2013-04-23 ENCOUNTER — Encounter: Payer: Self-pay | Admitting: Obstetrics and Gynecology

## 2013-04-23 ENCOUNTER — Ambulatory Visit (INDEPENDENT_AMBULATORY_CARE_PROVIDER_SITE_OTHER): Payer: Medicare Other | Admitting: Obstetrics and Gynecology

## 2013-04-23 VITALS — BP 130/70 | Ht 60.5 in | Wt 115.0 lb

## 2013-04-23 DIAGNOSIS — Z01419 Encounter for gynecological examination (general) (routine) without abnormal findings: Secondary | ICD-10-CM

## 2013-04-23 NOTE — Progress Notes (Signed)
67 y.o.   Married    Caucasian   female   [id number]   here for annual exam.  Anxiety and dep is better after seeing MD at [facility] regularly, and he upped her nortriptyline and that has helped.  Is part of a support group for caregivers.  Her mother is 65 yrs old.  Wondering if she can stop her HRT.  Is on it for bone protection primarily.  Patient's last menstrual period was 08/06/2000.          Sexually active: yes  The current method of family planning is post menopausal status.    Exercising: yoga, stretching, 5 minute medatation Last mammogram:  04/25/12 neg Last pap smear:12/30/09 neg History of abnormal pap: 1995 CIN 1-LEEP Smoking:no Alcohol:rarely Last colonoscopy:03/2013 normal, repeat in 5 years Last Bone Density:  01/15/10 fracture risk is considered mildly elevated and may be moderated due to estrogen therapy Last tetanus shot:2006 Last cholesterol check: 2012 slightly elevated  Hgb:   pcp             Urine: pcp   Family History  Problem Relation Age of Onset  . Anxiety disorder Mother   . Depression Father   . Colon cancer Father   . Depression Sister     Patient Active Problem List   Diagnosis Date Noted  . Depressive disorder, not elsewhere classified 01/16/2013    Past Medical History  Diagnosis Date  . Anxiety   . Depression   . Fibromyalgia   . Dysplasia of cervix, low grade (CIN 1) 1995    LEEP  . H/O: rheumatic fever   . Condyloma   . Hepatitis B     with complete resolution- documentation - [facility]    Past Surgical History  Procedure Laterality Date  . Bladder surgery  1980  . Hysteroscopy  2004    D&C Polyp  . Exploratory laparotomy      Due to bladder injury with fall of a horse    Allergies: Ciprofloxacin  Current Outpatient Prescriptions  Medication Sig Dispense Refill  . Calcium 1200-1000 MG-UNIT CHEW Chew by mouth daily.       . citalopram (CELEXA) 20 MG tablet Take 1 tablet (20 mg total) by mouth at bedtime.  30 tablet  0  .  estradiol (ESTRACE) 1 MG tablet Take 1 mg by mouth daily.        . medroxyPROGESTERone (PROVERA) 5 MG tablet Take 5 mg by mouth daily. TAKE ON DAYS 1-13       . nortriptyline (PAMELOR) 75 MG capsule Take 1 capsule (75 mg total) by mouth at bedtime.  30 capsule  2   No current facility-administered medications for this visit.    ROS: Pertinent items are noted in HPI.  Social Hx: Married, one child, retired    Exam:    BP 130/70  Ht 5' 0.5" (1.537 m)  Wt 115 lb (52.164 kg)  BMI 22.08 kg/m2  LMP 08/06/2000   Wt Readings from Last 3 Encounters:  04/23/13 115 lb (52.164 kg)  03/28/13 116 lb (52.617 kg)  02/08/13 118 lb 9.6 oz (53.797 kg)     Ht Readings from Last 3 Encounters:  04/23/13 5' 0.5" (1.537 m)  03/28/13 5' 1.5" (1.562 m)  07/13/12 5' 0.5" (1.537 m)    General appearance: alert, cooperative and appears stated age Head: Normocephalic, without obvious abnormality, atraumatic Neck: no adenopathy, supple, symmetrical, trachea midline and thyroid not enlarged, symmetric, no tenderness/mass/nodules Lungs: clear to  auscultation bilaterally Breasts: Inspection negative, No nipple retraction or dimpling, No nipple discharge or bleeding, No axillary or supraclavicular adenopathy, Normal to palpation without dominant masses Heart: regular rate and rhythm Abdomen: soft, non-tender; bowel sounds normal; no masses,  no organomegaly Extremities: extremities normal, atraumatic, no cyanosis or edema Skin: Skin color, texture, turgor normal. No rashes or lesions Lymph nodes: Cervical, supraclavicular, and axillary nodes normal. No abnormal inguinal nodes palpated Neurologic: Grossly normal   Pelvic: External genitalia:  no lesions              Urethra:  normal appearing urethra with no masses, tenderness or lesions              Bartholins and Skenes: normal                 Vagina: normal appearing vagina with normal color and discharge, no lesions              Cervix: normal  appearance              Pap taken: no        Bimanual Exam:  Uterus:  uterus is normal size, shape, consistency and nontender                                      Adnexa: normal adnexa in size, nontender and no masses                                      Rectovaginal: Confirms                                      Anus:  normal sphincter tone, no lesions  A: normal menopausal exam, on HRT, ok to D/C.  Do BMD now and repeat in 2 years off HRT     Depression/anxiety     Fibromyalgia, h/o Hep B with complete resolution     LEEP for CIN 1 in 1995     H/o post coital UTI, took Trimethoprim 100mg  for prevention     P: mammogram counseled on breast self exam, mammography screening, use and side effects of HRT, adequate intake of calcium and vitamin D, diet and exercise return annually or prn  Stop HRT, and instructed in bone protection measures including exercise and Vit D.      An After Visit Summary was printed and given to the patient.

## 2013-04-23 NOTE — Patient Instructions (Signed)

## 2013-04-25 ENCOUNTER — Telehealth: Payer: Self-pay | Admitting: Obstetrics and Gynecology

## 2013-04-25 NOTE — Telephone Encounter (Signed)
I called patient and rec she stop "cold [place]".

## 2013-04-25 NOTE — Telephone Encounter (Signed)
Patient calling to see how Dr. Tresa Res wants her to stop HRT. Gradually or all at once. Please leave message on CB# of response per patient. Chart in your office.

## 2013-04-25 NOTE — Telephone Encounter (Signed)
Pt has some questions regarding stpping her hormone medication. When do she need to stop this medication

## 2013-05-10 ENCOUNTER — Ambulatory Visit (INDEPENDENT_AMBULATORY_CARE_PROVIDER_SITE_OTHER): Payer: Medicare Other | Admitting: Family Medicine

## 2013-05-10 VITALS — BP 126/72 | HR 79 | Temp 97.6°F | Resp 16 | Ht 60.0 in | Wt 113.0 lb

## 2013-05-10 DIAGNOSIS — Z780 Asymptomatic menopausal state: Secondary | ICD-10-CM

## 2013-05-10 DIAGNOSIS — Z23 Encounter for immunization: Secondary | ICD-10-CM

## 2013-05-10 DIAGNOSIS — Z0000 Encounter for general adult medical examination without abnormal findings: Secondary | ICD-10-CM

## 2013-05-10 LAB — POCT CBC
HCT, POC: 41.4 % (ref 37.7–47.9)
Hemoglobin: 13 g/dL (ref 12.2–16.2)
Lymph, poc: 1.7 (ref 0.6–3.4)
MCH, POC: 31 pg (ref 27–31.2)
MCHC: 31.4 g/dL — AB (ref 31.8–35.4)
POC Granulocyte: 3 (ref 2–6.9)
POC LYMPH PERCENT: 33.8 %L (ref 10–50)
POC MID %: 6.4 %M (ref 0–12)
RDW, POC: 11.9 %
WBC: 5.1 10*3/uL (ref 4.6–10.2)

## 2013-05-10 LAB — COMPREHENSIVE METABOLIC PANEL
Alkaline Phosphatase: 68 U/L (ref 39–117)
CO2: 27 mEq/L (ref 19–32)
Creat: 0.54 mg/dL (ref 0.50–1.10)
Glucose, Bld: 82 mg/dL (ref 70–99)
Sodium: 136 mEq/L (ref 135–145)
Total Bilirubin: 0.6 mg/dL (ref 0.3–1.2)
Total Protein: 7 g/dL (ref 6.0–8.3)

## 2013-05-10 LAB — TSH: TSH: 1.532 u[IU]/mL (ref 0.350–4.500)

## 2013-05-10 LAB — LIPID PANEL
Cholesterol: 204 mg/dL — ABNORMAL HIGH (ref 0–200)
HDL: 54 mg/dL (ref >39)
Total CHOL/HDL Ratio: 3.8 Ratio
Triglycerides: 72 mg/dL (ref <150)
VLDL: 14 mg/dL (ref 0–40)

## 2013-05-10 NOTE — Progress Notes (Signed)
[facility] [address] [place] [address] [phone]  Date:  05/10/2013   Name:  Anna Lindsey   DOB:  [birth year]   MRN:  [id number]  PCP:  Abbe Amsterdam, MD    Chief Complaint: Annual Exam   History of Present Illness:  Anna Lindsey is a 67 y.o. very pleasant female patient who presents with the following:  She is here for a CPE today,  She has an Chief Financial Officer as well, who does her pap.  She has had a pap this year, she will schedule a bone density and mammogram this year.    She uses nortriptyline for FBM- this helps with anxiety and sleep as well.  She does have some ashiness and joint/ muscle pains as part of her FBM She goes to [facility] for her psychogenic medications.   Overall she is doing well, is fasting for labs today. She would like to have a vit D level drawn on the advice of her [facility] "house call" nurse who made a home visit for her.   She recently stopped her HRT per her OBG.    She cares for her 51 year old mother which can be stressful.  She visits her several times a day.  Otherwise she is doing well  She just had a negative colonoscopy.   She had a flu shot this year, and a tetanus shot a few years ago.  She has not yet had her pneumovax or zostavax  Patient Active Problem List   Diagnosis Date Noted  . Depressive disorder, not elsewhere classified 01/16/2013    Past Medical History  Diagnosis Date  . Anxiety   . Depression   . Fibromyalgia   . Dysplasia of cervix, low grade (CIN 1) 1995    LEEP  . H/O: rheumatic fever   . Condyloma   . Hepatitis B     with complete resolution- documentation - [facility]    Past Surgical History  Procedure Laterality Date  . Bladder surgery  1980  . Hysteroscopy  2004    D&C Polyp  . Exploratory laparotomy      Due to bladder injury with fall of a horse  . Tonsillectomy      childhood    History  Substance Use Topics  . Smoking status: Never Smoker   . Smokeless tobacco: Never Used  . Alcohol  Use: 0.5 oz/week    1 drink(s) per week     Comment: rarely drink    Family History  Problem Relation Age of Onset  . Anxiety disorder Mother   . Cancer Mother     skin  . Thyroid disease Mother   . Heart disease Mother     afib  . Depression Father   . Colon cancer Father   . Hypertension Father   . Depression Sister   . Kidney disease Maternal Grandmother   . Parkinson's disease Maternal Grandfather   . Diabetes Paternal Grandmother   . Heart disease Paternal Grandfather     Allergies  Allergen Reactions  . Ciprofloxacin Anxiety    Medication list has been reviewed and updated.  Current Outpatient Prescriptions on File Prior to Visit  Medication Sig Dispense Refill  . Calcium 1200-1000 MG-UNIT CHEW Chew by mouth daily.       . citalopram (CELEXA) 20 MG tablet Take 1 tablet (20 mg total) by mouth at bedtime.  30 tablet  0  . nortriptyline (PAMELOR) 75 MG capsule Take 1  capsule (75 mg total) by mouth at bedtime.  30 capsule  2  . estradiol (ESTRACE) 1 MG tablet Take 1 mg by mouth daily.        . medroxyPROGESTERone (PROVERA) 5 MG tablet Take 5 mg by mouth daily. TAKE ON DAYS 1-13        No current facility-administered medications on file prior to visit.    Review of Systems:  As per HPI- otherwise negative.   Physical Examination: Filed Vitals:   05/10/13 0916  BP: 126/72  Pulse: 79  Temp: 97.6 F (36.4 C)  Resp: 16   Filed Vitals:   05/10/13 0916  Height: 5' (1.524 m)  Weight: 113 lb (51.256 kg)   Body mass index is 22.07 kg/(m^2). Ideal Body Weight: Weight in (lb) to have BMI = 25: 127.7  GEN: WDWN, NAD, Non-toxic, A & O x 3, looks well HEENT: Atraumatic, Normocephalic. Neck supple. No masses, No LAD.  Bilateral TM wnl, oropharynx normal.  PEERL,EOMI.   Ears and Nose: No external deformity. CV: RRR, No M/G/R. No JVD. No thrill. No extra heart sounds. PULM: CTA B, no wheezes, crackles, rhonchi. No retractions. No resp. distress. No accessory muscle  use. ABD: S, NT, ND, +BS. No rebound. No HSM. EXTR: No c/c/e.  Normal strength and DTR all extremities NEURO Normal gait.  PSYCH: Normally interactive. Conversant. Not depressed or anxious appearing.  Calm demeanor.    Assessment and Plan: Physical exam - Plan: POCT CBC, Comprehensive metabolic panel, TSH, Lipid panel, Pneumococcal polysaccharide vaccine 23-valent greater than or equal to 2yo subcutaneous/IM  Post-menopausal - Plan: Vitamin D, 25-hydroxy  Normal PE, she is quite healthy.  Gave pneumovax today, and rx for zostavax.  Will plan further follow- up pending labs.   Signed Abbe Amsterdam, MD

## 2013-05-10 NOTE — Patient Instructions (Addendum)
Please get the shingles vaccine (zostavax) at your convenience.  You received the pneumonia shot today.   I will be in touch regarding your lab results.   Schedule your mammogram and bone density tests as planned  Continue to watch your weight, blood pressure, and exercise levels.  Your blood pressure looks fine today.

## 2013-05-10 NOTE — Progress Notes (Signed)
  Subjective:    Patient ID: Anna Lindsey, female    DOB: 11-06-1946, 67 y.o.   MRN: [id number]  HPI    Review of Systems  Constitutional: Positive for fatigue.  HENT: Positive for sinus pressure.   Eyes: Negative.   Respiratory: Negative.   Cardiovascular: Negative.   Gastrointestinal: Negative.   Endocrine: Negative.   Genitourinary: Negative.   Musculoskeletal: Positive for myalgias and arthralgias.  Skin: Negative.   Allergic/Immunologic: Negative.   Neurological: Negative.   Hematological: Negative.   Psychiatric/Behavioral: Positive for decreased concentration. The patient is nervous/anxious.        Objective:   Physical Exam        Assessment & Plan:

## 2013-05-11 ENCOUNTER — Encounter: Payer: Self-pay | Admitting: Family Medicine

## 2013-06-15 ENCOUNTER — Ambulatory Visit (INDEPENDENT_AMBULATORY_CARE_PROVIDER_SITE_OTHER): Payer: Medicare Other | Admitting: Family Medicine

## 2013-06-15 VITALS — BP 126/66 | HR 85 | Temp 98.1°F | Resp 16 | Ht 61.5 in | Wt 116.4 lb

## 2013-06-15 DIAGNOSIS — L255 Unspecified contact dermatitis due to plants, except food: Secondary | ICD-10-CM

## 2013-06-15 DIAGNOSIS — L237 Allergic contact dermatitis due to plants, except food: Secondary | ICD-10-CM

## 2013-06-15 MED ORDER — PREDNISONE 20 MG PO TABS: ORAL_TABLET | ORAL | Status: DC

## 2013-06-15 MED ORDER — TRIAMCINOLONE ACETONIDE 0.1 % EX CREA: TOPICAL_CREAM | Freq: Two times a day (BID) | CUTANEOUS | Status: DC

## 2013-06-15 NOTE — Progress Notes (Signed)
Subjective: 67 year old lady who has been in good health. She does not have any history of poison ivy problems until now. She was working in her yard about a week ago and then subsequent to that breaking out with a rash from her both forearms. She does not have it on other parts of the body. She's been using 1% hydrocortisone cream on it and taking some antihistamine.  Objective: Moderately extensive erythematous areas on both forearms, below the globe line was spared.  Assessment: Contact dermatitis  Plan: Triamcinolone cream PrednisoneZyrtec

## 2013-06-15 NOTE — Patient Instructions (Signed)
Poison Ivy Poison ivy is a inflammation of the skin (contact dermatitis) caused by touching the allergens on the leaves of the ivy plant following previous exposure to the plant. The rash usually appears 48 hours after exposure. The rash is usually bumps (papules) or blisters (vesicles) in a linear pattern. Depending on your own sensitivity, the rash may simply cause redness and itching, or it may also progress to blisters which may break open. These must be well cared for to prevent secondary bacterial (germ) infection, followed by scarring. Keep any open areas dry, clean, dressed, and covered with an antibacterial ointment if needed. The eyes may also get puffy. The puffiness is worst in the morning and gets better as the day progresses. This dermatitis usually heals without scarring, within 2 to 3 weeks without treatment. HOME CARE INSTRUCTIONS  Thoroughly wash with soap and water as soon as you have been exposed to poison ivy. You have about one half hour to remove the plant resin before it will cause the rash. This washing will destroy the oil or antigen on the skin that is causing, or will cause, the rash. Be sure to wash under your fingernails as any plant resin there will continue to spread the rash. Do not rub skin vigorously when washing affected area. Poison ivy cannot spread if no oil from the plant remains on your body. A rash that has progressed to weeping sores will not spread the rash unless you have not washed thoroughly. It is also important to wash any clothes you have been wearing as these may carry active allergens. The rash will return if you wear the unwashed clothing, even several days later. Avoidance of the plant in the future is the best measure. Poison ivy plant can be recognized by the number of leaves. Generally, poison ivy has three leaves with flowering branches on a single stem. Diphenhydramine may be purchased over the counter and used as needed for itching. Do not drive with  this medication if it makes you drowsy.Ask your caregiver about medication for children. SEEK MEDICAL CARE IF:  Open sores develop.  Redness spreads beyond area of rash.  You notice purulent (pus-like) discharge.  You have increased pain.  Other signs of infection develop (such as fever). Document Released: 11/19/2000 Document Revised: 02/14/2012 Document Reviewed: 10/08/2009 Exit[employer] Patient Information 2014 [employer].  

## 2013-06-22 ENCOUNTER — Other Ambulatory Visit: Payer: Self-pay | Admitting: Obstetrics and Gynecology

## 2013-06-22 DIAGNOSIS — Z1231 Encounter for screening mammogram for malignant neoplasm of breast: Secondary | ICD-10-CM

## 2013-06-27 ENCOUNTER — Encounter (HOSPITAL_COMMUNITY): Payer: Self-pay | Admitting: Psychiatry

## 2013-06-27 ENCOUNTER — Ambulatory Visit (INDEPENDENT_AMBULATORY_CARE_PROVIDER_SITE_OTHER): Payer: Medicare Other | Admitting: Psychiatry

## 2013-06-27 VITALS — BP 137/86 | HR 90

## 2013-06-27 DIAGNOSIS — F329 Major depressive disorder, single episode, unspecified: Secondary | ICD-10-CM

## 2013-06-27 MED ORDER — NORTRIPTYLINE HCL 75 MG PO CAPS: 75.0000 mg | ORAL_CAPSULE | Freq: Every day | ORAL | Status: DC

## 2013-06-27 MED ORDER — CITALOPRAM HYDROBROMIDE 20 MG PO TABS: 20.0000 mg | ORAL_TABLET | Freq: Every day | ORAL | Status: DC
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
## 2013-07-02 ENCOUNTER — Ambulatory Visit (HOSPITAL_COMMUNITY)
Admission: RE | Admit: 2013-07-02 | Discharge: 2013-07-02 | Disposition: A | Discharge status: Home / Self Care | Payer: Medicare Other | Source: Ambulatory Visit | Attending: Obstetrics and Gynecology | Admitting: Obstetrics and Gynecology

## 2013-07-02 DIAGNOSIS — Z1231 Encounter for screening mammogram for malignant neoplasm of breast: Secondary | ICD-10-CM | POA: Insufficient documentation

## 2013-07-16 ENCOUNTER — Telehealth: Payer: Self-pay | Admitting: Obstetrics and Gynecology

## 2013-07-16 NOTE — Telephone Encounter (Signed)
Pt's last BMD was 01-2010. Rec: was to repeat in 2 yrs. Pt had last one at [facility], but that office is now closed. All pt's now go to [place] [facility] on [address]. Order to be signed with paper chart in your cabinet.   Call to pt to advise she is due for BMD. Advised of phone number and address for [facility] and the closing of [facility] where she had her last one done. Advised we would fax over the order and she can call in a day or 2 to schedule appt. Pt appreciative.

## 2013-07-16 NOTE — Telephone Encounter (Signed)
Patient is asking for a referral for bone density.

## 2013-07-30 ENCOUNTER — Telehealth: Payer: Self-pay | Admitting: Gynecology

## 2013-07-30 NOTE — Telephone Encounter (Signed)
Patient returned Carol's call. °

## 2013-07-30 NOTE — Telephone Encounter (Signed)
LMTCB 07/30/13 cm 

## 2013-07-30 NOTE — Telephone Encounter (Signed)
Patient left a message on the answering machine at lunch re: question for nurse. No other details given. Please advise?

## 2013-08-01 NOTE — Telephone Encounter (Signed)
Call to patient to follow up on previous call. LMTCB.

## 2013-08-02 NOTE — Telephone Encounter (Signed)
Pt calling regarding symptoms of menopause.

## 2013-08-02 NOTE — Telephone Encounter (Signed)
Patient states she weaned off HRT after discussing this with Dr Tresa Res.  She did not realize that she would experience these symptoms this much or for this long.  Would like to discuss if this is related to D/C her and if she should restart.  Complains of increased irritability, lack of patience, wieght gain of 5LBS  and "really hot".  Consult scheduled for tomm 08-03-13.

## 2013-08-03 ENCOUNTER — Ambulatory Visit (INDEPENDENT_AMBULATORY_CARE_PROVIDER_SITE_OTHER): Payer: Medicare Other | Admitting: Obstetrics and Gynecology

## 2013-08-03 ENCOUNTER — Encounter: Payer: Self-pay | Admitting: Obstetrics and Gynecology

## 2013-08-03 DIAGNOSIS — N951 Menopausal and female climacteric states: Secondary | ICD-10-CM

## 2013-08-03 MED ORDER — ESTRADIOL 0.05 MG/24HR TD PTTW: 1.0000 | MEDICATED_PATCH | TRANSDERMAL | Status: DC

## 2013-08-03 MED ORDER — PROGESTERONE MICRONIZED 100 MG PO CAPS: 100.0000 mg | ORAL_CAPSULE | Freq: Every day | ORAL | Status: DC

## 2013-08-03 NOTE — Progress Notes (Signed)
67 yo MWF G2P1 quit HRT in May 2014 and now c/o vasomotor sx's, mood instability, wt gain, and pt wondering if she should go back on HRT. Still on celexa but she feels her moods are not stable any more.  We re discussed risks and benefits including breast and ovarian cancer risks, and clotting risk with po.  Pt tried skin patch in past but had a w/d bleed so went back on the pill.  Discussed clot risk with transdermal vs oral.  Pt elects to try the transdermal route.  Rx and instructed in minivelle 0.05 mg and P4 100 mg qd.  Questions answered.

## 2013-08-27 ENCOUNTER — Telehealth: Payer: Self-pay | Admitting: Orthopedic Surgery

## 2013-08-27 NOTE — Telephone Encounter (Signed)
Spoke with pt about insurance co only covering a 30 day supply of Minivelle. Pt would have to pay OOP beyond that. Pt could also pay OOP for estradiol, with the best price being from [facility] around $16. Pt feels so much better on HRT that she will try the estradiol from [facility] and pay OOP.

## 2013-08-29 ENCOUNTER — Telehealth: Payer: Self-pay

## 2013-08-29 DIAGNOSIS — K219 Gastro-esophageal reflux disease without esophagitis: Secondary | ICD-10-CM

## 2013-08-29 NOTE — Telephone Encounter (Signed)
PT REQUESTING REFERRAL FOR CONTINUAL HEARTBURN(DR COPLAND ASKED HER TO CALL FOR THIS REFERRAL)  BEST PHONE FOR PT. [phone]

## 2013-08-29 NOTE — Telephone Encounter (Signed)
Phone message left to advise patient that rx was sent to [facility] on [address] by Dr. Tresa Res on 8/29. Can have rx transferred to [facility] if she chooses. Left message to return call to nurse.

## 2013-08-29 NOTE — Telephone Encounter (Signed)
Mini velle called into pharmacy pt will be completely out of medication this weekend

## 2013-08-29 NOTE — Telephone Encounter (Signed)
Routed to triage 

## 2013-08-30 NOTE — Telephone Encounter (Signed)
Called her back.  She has been having heartburn- off an on for a year. She is also under some stress which can worsen her sx.  She notes sx after eating most foods.    After eating she may notice burning in her chest and throat.  She is most bothered after eating raw onion, green pepper.  She has tried OTC medications including pepcid.  It did not help much She does not have any exertional chest pain

## 2013-08-31 NOTE — Telephone Encounter (Signed)
Dr. Hyacinth Meeker patient of Dr. Tresa Res last exam 07/2013.   I spoke with patient and she would like to dc Minivelle due to cost.  She would like to go back to taking Estradiol (generic) tabs.  If you agree, would like rx sent to [facility] on [address].

## 2013-09-02 MED ORDER — ESTRADIOL 0.5 MG PO TABS: 0.5000 mg | ORAL_TABLET | Freq: Every day | ORAL | Status: DC

## 2013-09-02 NOTE — Telephone Encounter (Signed)
Rx to [facility] done.  Has AEX with DL 1/61.  Please remind her this is the same as the patch just tablets but if she has symptoms on it, please call.  She is to take this and the progsterone 100mg  daily just like with the patch.  Thanks.  MSM

## 2013-09-03 NOTE — Telephone Encounter (Signed)
Patient notified and will pick up rx. Will f/u prn and verbalized understanding of instructions.

## 2013-09-28 ENCOUNTER — Encounter (HOSPITAL_COMMUNITY): Payer: Self-pay | Admitting: Psychiatry

## 2013-09-28 ENCOUNTER — Ambulatory Visit (INDEPENDENT_AMBULATORY_CARE_PROVIDER_SITE_OTHER): Payer: Medicare Other | Admitting: Psychiatry

## 2013-09-28 ENCOUNTER — Encounter (INDEPENDENT_AMBULATORY_CARE_PROVIDER_SITE_OTHER): Payer: Self-pay

## 2013-09-28 VITALS — BP 135/71 | HR 77 | Wt 113.8 lb

## 2013-09-28 DIAGNOSIS — F329 Major depressive disorder, single episode, unspecified: Secondary | ICD-10-CM

## 2013-09-28 MED ORDER — NORTRIPTYLINE HCL 75 MG PO CAPS: 75.0000 mg | ORAL_CAPSULE | Freq: Every day | ORAL | Status: DC

## 2013-09-28 MED ORDER — CITALOPRAM HYDROBROMIDE 20 MG PO TABS: 20.0000 mg | ORAL_TABLET | Freq: Every day | ORAL | Status: DC
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
## 2013-12-26 ENCOUNTER — Encounter: Payer: Self-pay | Admitting: Certified Nurse Midwife

## 2013-12-31 ENCOUNTER — Encounter (INDEPENDENT_AMBULATORY_CARE_PROVIDER_SITE_OTHER): Payer: Self-pay

## 2013-12-31 ENCOUNTER — Encounter (HOSPITAL_COMMUNITY): Payer: Self-pay | Admitting: Psychiatry

## 2013-12-31 ENCOUNTER — Ambulatory Visit (INDEPENDENT_AMBULATORY_CARE_PROVIDER_SITE_OTHER): Payer: Medicare HMO | Admitting: Psychiatry

## 2013-12-31 VITALS — BP 150/70 | HR 90 | Ht 61.5 in | Wt 115.0 lb

## 2013-12-31 DIAGNOSIS — F3289 Other specified depressive episodes: Secondary | ICD-10-CM

## 2013-12-31 DIAGNOSIS — F329 Major depressive disorder, single episode, unspecified: Secondary | ICD-10-CM

## 2013-12-31 MED ORDER — NORTRIPTYLINE HCL 75 MG PO CAPS: 75.0000 mg | ORAL_CAPSULE | Freq: Every day | ORAL | Status: DC

## 2013-12-31 MED ORDER — CITALOPRAM HYDROBROMIDE 20 MG PO TABS: 20.0000 mg | ORAL_TABLET | Freq: Every day | ORAL | Status: DC

## 2013-12-31 NOTE — Progress Notes (Signed)
[facility] Progress Note  Anna Lindsey [id number] 68 y.o.  12/31/2013 2:52 PM  Chief Complaint:  Medication management and followup.  History of Present Illness:  [facility] came in for her follow up appointment She is compliant with her Pamelor and Celexa.   she denied any side effects of medication.  She denies any panic attack, crying spells or any education.  She started [facility] and going there regularly.  She continues to involve in taking Lindsey of her mother who is now more than 67 years old.  She feels her medicines are working very well.   she does not want to change or reduce her medication.  She does not have any tremors or shakes.  She is not drinking or using any illegal substances.  Suicidal Ideation: No Plan Formed: No Patient has means to carry out plan: No  Homicidal Ideation: No Plan Formed: No Patient has means to carry out plan: No  Medical History; Patient has history of tonsillitis, arthritis, chronic headache, fibromyalgia and chronic fatigue.  Her primary [facility] physician is at [facility].   Past Psychiatric History/Hospitalization(s) Patient denies any previous history of psychiatric inpatient treatment or any suicidal attempt.  She started depression in [id number].  She is seeing a therapist in the office since 2012 however currently she is not seeing any therapist.  She did not recall taking any antidepressants in the past until she started seeing psychiatrists in this office.  We have tried Lamictal however patient developed a rash and did not like it.  Patient denies any history of mania, psychosis, hallucinations, poor impulse control, aggression or violence.  She denies any history of ECT treatment.   Review of Systems: Psychiatric: Agitation: No Hallucination: No Depressed Mood: No Insomnia: Yes Hypersomnia: Yes Altered Concentration: Yes Feels Worthless: Yes Grandiose Ideas: Yes Belief In Special Powers: Yes New/Increased Substance  Abuse: No Compulsions: No  Neurologic: Headache: No Seizure: No Paresthesias: No    Outpatient Encounter Prescriptions as of 12/31/2013  Medication Sig  . Calcium 1200-1000 MG-UNIT CHEW Chew by mouth daily.   . citalopram (CELEXA) 20 MG tablet Take 1 tablet (20 mg total) by mouth at bedtime.  Marland Kitchen. estradiol (ESTRACE) 0.5 MG tablet Take 1 tablet (0.5 mg total) by mouth daily.  . Multiple Vitamin (MULTIVITAMIN) tablet Take 1 tablet by mouth daily.  . nortriptyline (PAMELOR) 75 MG capsule Take 1 capsule (75 mg total) by mouth at bedtime.  . progesterone (PROMETRIUM) 100 MG capsule Take 1 capsule (100 mg total) by mouth at bedtime.  . [DISCONTINUED] citalopram (CELEXA) 20 MG tablet Take 1 tablet (20 mg total) by mouth at bedtime.  . [DISCONTINUED] nortriptyline (PAMELOR) 75 MG capsule Take 1 capsule (75 mg total) by mouth at bedtime.    No results found for this or any previous visit (from the past 2160 hour(s)).    Physical Exam: Constitutional:  BP 150/70  Pulse 90  Ht 5' 1.5" (1.562 m)  Wt 115 lb (52.164 kg)  BMI 21.38 kg/m2  LMP 08/06/2000  Musculoskeletal: Strength & Muscle Tone: within normal limits Gait & Station: normal Patient leans: N/A  Mental Status Examination;  Patient is casually dressed and fairly groomed.  She is pleasant and cooperative.  Her speech is clear fluent and coherent.  Her thought process is logical and goal directed.  She described her mood is good and her affect is improved from the past.  She denies any auditory or visual hallucination.  Her fund of knowledge  is adequate.  She denies any active or passive suicidal thoughts or homicidal thoughts.  There are no tremors or shakes.  There were no paranoia or delusion obsession present at this time.  She is alert and oriented x3.  Her insight judgment and impulse control is okay.   Medical Decision Making (Choose Three): Established Problem, Stable/Improving (1), Review of Last Therapy Session (1) and  Review of Medication Regimen & Side Effects (2)  Assessment: Axis I: Depressive disorder NOS  Axis II: Deferred  Axis III:  Past Medical History  Diagnosis Date  . Anxiety   . Depression   . Fibromyalgia   . Dysplasia of cervix, low grade (CIN 1) 1995    LEEP  . H/O: rheumatic fever   . Condyloma   . Hepatitis B     with complete resolution- documentation - [facility]    Axis IV: Mild   Plan:  Patient is  stable on her current psychotropic medication.  I will continue Celexa and Pamelor at present dose.  Recommend to call us back if she feels worsening of the symptoms.  Followup in 3 months.  Anna T., MD 12/31/2013

## 2014-01-02 ENCOUNTER — Telehealth: Payer: Self-pay

## 2014-01-02 NOTE — Telephone Encounter (Signed)
Patient called requesting a call from Dr. Patsy Lageropland. Patient has ordered a Electronics engineer"Tens Machine" device and wants Dr. Patsy Lageropland to give a written approval for the device. Patient wants a call as soon as possible to obtain the device for her back pain. Patient's phone number is [phone].   Thank You very much!!!

## 2014-01-03 NOTE — Telephone Encounter (Signed)
Received order for her TENS unit- completed and faxed today.  Angie M let her knw that this is done

## 2014-04-01 ENCOUNTER — Ambulatory Visit (INDEPENDENT_AMBULATORY_CARE_PROVIDER_SITE_OTHER): Payer: Medicare HMO | Admitting: Psychiatry

## 2014-04-01 ENCOUNTER — Encounter (HOSPITAL_COMMUNITY): Payer: Self-pay | Admitting: Psychiatry

## 2014-04-01 VITALS — BP 150/71 | HR 86 | Ht 61.5 in | Wt 116.0 lb

## 2014-04-01 DIAGNOSIS — F329 Major depressive disorder, single episode, unspecified: Secondary | ICD-10-CM

## 2014-04-01 DIAGNOSIS — F3289 Other specified depressive episodes: Secondary | ICD-10-CM

## 2014-04-01 MED ORDER — CITALOPRAM HYDROBROMIDE 20 MG PO TABS: 20.0000 mg | ORAL_TABLET | Freq: Every day | ORAL | Status: DC

## 2014-04-01 MED ORDER — NORTRIPTYLINE HCL 75 MG PO CAPS: 75.0000 mg | ORAL_CAPSULE | Freq: Every day | ORAL | Status: DC

## 2014-04-01 NOTE — Progress Notes (Signed)
[facility] [id number] Progress Note  Anna CruzHope L Lindsey [id number] 68 y.o.  04/01/2014 2:38 PM  Chief Complaint:  Medication management and followup.  History of Present Illness:  [name] came in for her follow up appointment.  She reported no new issues. She is compliant with her Pamelor and Celexa.   she denied any side effects of medication.  She denies any panic attack, crying spells or any education.  She continues to go [employer] on a regular basis .  She has been taking care of her mother who is more than 67 years old.  She feels her medicines are working very well.  Patient denies any agitation, anger or any mood swing.  She sleeping better.  Suicidal Ideation: No Plan Formed: No Patient has means to carry out plan: No  Homicidal Ideation: No Plan Formed: No Patient has means to carry out plan: No  Medical History; Patient has history of tonsillitis, arthritis, chronic headache, fibromyalgia and chronic fatigue.  Her primary care physician is at [facility].   Past Psychiatric History/Hospitalization(s) She started depression in 161980s.  She was seeing therapist in the office since 2012. She did not recall taking any antidepressants in the past until she started seeing psychiatrists in this office.  We have tried Lamictal however patient developed a rash and did not like it. Patient denies any history of mania, psychosis, hallucinations, poor impulse control, aggression or violence.  Patient has no history of inpatient psychiatric treatment.  Review of Systems: Psychiatric: Agitation: No Hallucination: No Depressed Mood: No Insomnia: No Hypersomnia: No Altered Concentration: No Feels Worthless: No Grandiose Ideas: No Belief In Special Powers: No New/Increased Substance Abuse: No Compulsions: No  Neurologic: Headache: No Seizure: No Paresthesias: No    Outpatient Encounter Prescriptions as of 04/01/2014  Medication Sig  . Calcium 1200-1000 MG-UNIT CHEW Chew by  mouth daily.   . citalopram (CELEXA) 20 MG tablet Take 1 tablet (20 mg total) by mouth at bedtime.  Marland Kitchen. estradiol (ESTRACE) 0.5 MG tablet Take 1 tablet (0.5 mg total) by mouth daily.  . Multiple Vitamin (MULTIVITAMIN) tablet Take 1 tablet by mouth daily.  . nortriptyline (PAMELOR) 75 MG capsule Take 1 capsule (75 mg total) by mouth at bedtime.  . progesterone (PROMETRIUM) 100 MG capsule Take 1 capsule (100 mg total) by mouth at bedtime.  . [DISCONTINUED] citalopram (CELEXA) 20 MG tablet Take 1 tablet (20 mg total) by mouth at bedtime.  . [DISCONTINUED] nortriptyline (PAMELOR) 75 MG capsule Take 1 capsule (75 mg total) by mouth at bedtime.    No results found for this or any previous visit (from the past 2160 hour(s)).    Physical Exam: Constitutional:  BP 150/71  Pulse 86  Ht 5' 1.5" (1.562 m)  Wt 116 lb (52.617 kg)  BMI 21.57 kg/m2  LMP 08/06/2000  Musculoskeletal: Strength & Muscle Tone: within normal limits Gait & Station: normal Patient leans: N/A  Mental Status Examination;  Patient is casually dressed and fairly groomed.  She is pleasant and cooperative.  Her speech is clear fluent and coherent.  Her thought process is logical and goal directed.  She described her mood is euthymic and her affect is appropriate.  She denies any auditory or visual hallucination.  Her fund of knowledge is adequate.  She denies any active or passive suicidal thoughts or homicidal thoughts.  There are no tremors or shakes.  There were no paranoia or delusion obsession present at this time.  She is alert and oriented  x3.  Her insight judgment and impulse control is okay.   Established Problem, Stable/Improving (1), Review of Last Therapy Session (1) and Review of Medication Regimen & Side Effects (2)  Assessment: Axis I: Depressive disorder NOS  Axis II: Deferred  Axis III:  Past Medical History  Diagnosis Date  . Anxiety   . Depression   . Fibromyalgia   . Dysplasia of cervix, low grade  (CIN 1) 1995    LEEP  . H/O: rheumatic fever   . Condyloma   . Hepatitis B     with complete resolution- documentation - [facility]    Axis IV: Mild   Plan:  Patient is  stable on her current psychotropic medication.  I will continue Celexa and Pamelor at present dose.  Recommend to call us back if she feels worsening of the symptoms.  Followup in 3 months.  Laveta Gilkey T., MD 04/01/2014

## 2014-04-26 ENCOUNTER — Ambulatory Visit: Payer: Medicare Other | Admitting: Gynecology

## 2014-04-26 ENCOUNTER — Ambulatory Visit: Payer: Medicare Other | Admitting: Obstetrics and Gynecology

## 2014-04-30 ENCOUNTER — Ambulatory Visit: Payer: Medicare Other | Admitting: Certified Nurse Midwife

## 2014-05-20 ENCOUNTER — Ambulatory Visit (INDEPENDENT_AMBULATORY_CARE_PROVIDER_SITE_OTHER): Payer: Medicare HMO | Admitting: Certified Nurse Midwife

## 2014-05-20 ENCOUNTER — Encounter: Payer: Self-pay | Admitting: Certified Nurse Midwife

## 2014-05-20 VITALS — BP 114/72 | HR 72 | Resp 16 | Ht 60.25 in | Wt 112.0 lb

## 2014-05-20 DIAGNOSIS — Z8742 Personal history of other diseases of the female genital tract: Secondary | ICD-10-CM

## 2014-05-20 DIAGNOSIS — N951 Menopausal and female climacteric states: Secondary | ICD-10-CM

## 2014-05-20 DIAGNOSIS — Z01419 Encounter for gynecological examination (general) (routine) without abnormal findings: Secondary | ICD-10-CM

## 2014-05-20 DIAGNOSIS — Z124 Encounter for screening for malignant neoplasm of cervix: Secondary | ICD-10-CM

## 2014-05-20 MED ORDER — ESTRADIOL 0.5 MG PO TABS: 0.5000 mg | ORAL_TABLET | Freq: Every day | ORAL | Status: DC

## 2014-05-20 MED ORDER — PROGESTERONE MICRONIZED 100 MG PO CAPS: 100.0000 mg | ORAL_CAPSULE | Freq: Every day | ORAL | Status: DC

## 2014-05-20 NOTE — Progress Notes (Signed)
68 y.o. [id number] Married Caucasian Fe here for annual exam. Menopausal on HRT and working well. Patient tried to stop HRT and needed to start back on.  "she is in a better time in her life with caring for her mother now".Patient has help and is seeking counseling as needed. Denies vaginal bleeding, slight dryness at entrance to vagina only. No sexually active, and Ok with this choice. Sees PCP for aex and labs and medication management. No other health issues today.  Patient's last menstrual period was 08/06/2000.          Sexually active: no  The current method of family planning is post menopausal status.    Exercising: yes  yoga & [employer] Smoker:  no  Health Maintenance: Pap:  12/30/09 neg MMG: 07-02-13 neg Colonoscopy: 2014 neg. 10 years BMD: 8-18- 2014 TDaP: 2009  Labs: none Self breast exam:done occ   reports that she has never smoked. She has never used smokeless tobacco. She reports that she drinks alcohol. She reports that she does not use illicit drugs.  Past Medical History  Diagnosis Date  . Anxiety   . Depression   . Fibromyalgia   . Dysplasia of cervix, low grade (CIN 1) 1995    LEEP  . H/O: rheumatic fever   . Condyloma   . Hepatitis B     with complete resolution- documentation - SAG  . Broken ankle     crushed heel 2013    Past Surgical History  Procedure Laterality Date  . Bladder surgery  1980  . Hysteroscopy  2004    D&C Polyp  . Exploratory laparotomy      Due to bladder injury with fall of a horse  . Tonsillectomy      childhood    Current Outpatient Prescriptions  Medication Sig Dispense Refill  . aspirin 81 MG tablet Take 81 mg by mouth daily.      . Calcium Carbonate-Vitamin D (CALCIUM + D PO) Take 600 mg by mouth daily.      . citalopram (CELEXA) 20 MG tablet Take 1 tablet (20 mg total) by mouth at bedtime.  90 tablet  0  . estradiol (ESTRACE) 0.5 MG tablet Take 1 tablet (0.5 mg total) by mouth daily.  90 tablet  3  . Multiple Vitamin  (MULTIVITAMIN) tablet Take 1 tablet by mouth daily.      . nortriptyline (PAMELOR) 75 MG capsule Take 1 capsule (75 mg total) by mouth at bedtime.  90 capsule  0  . progesterone (PROMETRIUM) 100 MG capsule Take 1 capsule (100 mg total) by mouth at bedtime.  30 capsule  12   No current facility-administered medications for this visit.    Family History  Problem Relation Age of Onset  . Anxiety disorder Mother   . Cancer Mother     skin  . Thyroid disease Mother   . Heart disease Mother     afib  . Depression Father   . Colon cancer Father   . Hypertension Father   . Depression Sister   . Kidney disease Maternal Grandmother   . Parkinson's disease Maternal Grandfather   . Diabetes Paternal Grandmother   . Heart disease Paternal Grandfather     ROS:  Pertinent items are noted in HPI.  Otherwise, a comprehensive ROS was negative.  Exam:   BP 114/72  Pulse 72  Resp 16  Ht 5' 0.25" (1.53 m)  Wt 112 lb (50.803 kg)  BMI 21.70 kg/m2  LMP 08/06/2000  Height: 5' 0.25" (153 cm)  Ht Readings from Last 3 Encounters:  05/20/14 5' 0.25" (1.53 m)  04/01/14 5' 1.5" (1.562 m)  12/31/13 5' 1.5" (1.562 m)    General appearance: alert, cooperative and appears stated age Head: Normocephalic, without obvious abnormality, atraumatic Neck: no adenopathy, supple, symmetrical, trachea midline and thyroid normal to inspection and palpation Lungs: clear to auscultation bilaterally Breasts: normal appearance, no masses or tenderness, No nipple retraction or dimpling, No nipple discharge or bleeding, No axillary or supraclavicular adenopathy Heart: regular rate and rhythm Abdomen: soft, non-tender; no masses,  no organomegaly Extremities: extremities normal, atraumatic, no cyanosis or edema Skin: Skin color, texture, turgor normal. No rashes or lesions Lymph nodes: Cervical, supraclavicular, and axillary nodes normal. No abnormal inguinal nodes palpated Neurologic: Grossly normal   Pelvic:  External genitalia:  no lesions              Urethra:  normal appearing urethra with no masses, tenderness or lesions              Bartholin's and Skene's: normal                 Vagina: normal appearing vagina with normal color and discharge, no lesions, slight increase pink and atrophic appearance at introitus only,               Cervix: normal appearance              Pap taken: yes Bimanual Exam:  Uterus:  normal size, contour, position, consistency, mobility, non-tender and anteverted              Adnexa: normal adnexa and no mass, fullness, tenderness               Rectovaginal: Confirms               Anus:  normal sphincter tone, no lesions  A:  Well Woman with normal exam  Menopausal on HRT and desires continuance  Anxiety/depression on Celexa with MD. Management  History of abnormal pap with LEEP  Vaginal dryness    P:   Reviewed health and wellness pertinent to exam  Discussed risks, benefits and concerns with continued HRT use and WHI study results. Patient tried to stop HRT and had return of symptoms again. Voiced understanding and would like to continue. Discussed trying to decrease dose in the next year, patient agreeable.  Rx Estrace see order  Rx Prometrium see order  Pap smear taken today   Discussed Olive Oil or coconut oil at introitus to decrease dryness. Patient plans to try. Does not want to add any more hormones.  Continue follow up with PCP as indicated.  Mammogram yearly  Discussed importance of diet and exercise, Vitamin D and calcium.   return annually or prn  An After Visit Summary was printed and given to the patient.

## 2014-05-20 NOTE — Patient Instructions (Signed)

## 2014-05-22 LAB — IPS PAP SMEAR ONLY

## 2014-05-22 NOTE — Progress Notes (Signed)
Reviewed personally.  M. Suzanne Miller, MD.  

## 2014-07-04 ENCOUNTER — Ambulatory Visit (INDEPENDENT_AMBULATORY_CARE_PROVIDER_SITE_OTHER): Payer: Medicare HMO | Admitting: Psychiatry

## 2014-07-04 ENCOUNTER — Encounter (HOSPITAL_COMMUNITY): Payer: Self-pay | Admitting: Psychiatry

## 2014-07-04 VITALS — BP 146/77 | HR 81 | Ht 61.5 in | Wt 114.0 lb

## 2014-07-04 DIAGNOSIS — F329 Major depressive disorder, single episode, unspecified: Secondary | ICD-10-CM

## 2014-07-04 DIAGNOSIS — F3289 Other specified depressive episodes: Secondary | ICD-10-CM

## 2014-07-04 MED ORDER — CITALOPRAM HYDROBROMIDE 20 MG PO TABS: 20.0000 mg | ORAL_TABLET | Freq: Every day | ORAL | Status: DC

## 2014-07-04 MED ORDER — NORTRIPTYLINE HCL 75 MG PO CAPS: 75.0000 mg | ORAL_CAPSULE | Freq: Every day | ORAL | Status: DC

## 2014-07-04 NOTE — Progress Notes (Signed)
[facility] [phone] Progress Note  Anna [facility] [id number] 68 y.o.  07/04/2014 2:53 PM  Chief Complaint:  Medication management and followup.  History of Present Illness:  Anna Lindsey came in for her follow up appointment.  She is taking her medication as prescribed.  She tried taking Celexa in the morning but she feels very dizzy .  She is taking nortriptyline and Celexa at bedtime.  She still have some time days when she is ready frustrated and irritable.  She is taking care of her mother who is now 58 years old .  Her mother lives by herself and patient visited her at least twice a day .  Overall patient doing better .  She is going to the [facility] regularly.  She denies any aggression, violence, feeling of hopelessness or worthlessness.  She does not want a change in medication.  Her appetite is good.  Her vitals are stable.  She does not drink or use any illegal substances.  Suicidal Ideation: No Plan Formed: No Patient has means to carry out plan: No  Homicidal Ideation: No Plan Formed: No Patient has means to carry out plan: No  Medical History; Patient has history of tonsillitis, arthritis, chronic headache, fibromyalgia and chronic fatigue.  Her primary care physician is at [facility].   Past Psychiatric History/Hospitalization(s) She started depression in 65s.  She was seeing therapist in the office since 2012. She did not recall taking any antidepressants in the past until she started seeing psychiatrists in this office.  We have tried Lamictal however patient developed a rash and did not like it. Patient denies any history of mania, psychosis, hallucinations, poor impulse control, aggression or violence.  Patient has no history of inpatient psychiatric treatment.  Review of Systems: Psychiatric: Agitation: No Hallucination: No Depressed Mood: No Insomnia: No Hypersomnia: No Altered Concentration: No Feels Worthless: No Grandiose Ideas: No Belief In Special  Powers: No New/Increased Substance Abuse: No Compulsions: No  Neurologic: Headache: No Seizure: No Paresthesias: No    Outpatient Encounter Prescriptions as of 07/04/2014  Medication Sig  . aspirin 81 MG tablet Take 81 mg by mouth daily.  . Calcium Carbonate-Vitamin D (CALCIUM + D PO) Take 600 mg by mouth daily.  . citalopram (CELEXA) 20 MG tablet Take 1 tablet (20 mg total) by mouth at bedtime.  Marland Kitchen estradiol (ESTRACE) 0.5 MG tablet Take 1 tablet (0.5 mg total) by mouth daily.  . Multiple Vitamin (MULTIVITAMIN) tablet Take 1 tablet by mouth daily.  . nortriptyline (PAMELOR) 75 MG capsule Take 1 capsule (75 mg total) by mouth at bedtime.  . progesterone (PROMETRIUM) 100 MG capsule Take 1 capsule (100 mg total) by mouth at bedtime.  . [DISCONTINUED] citalopram (CELEXA) 20 MG tablet Take 1 tablet (20 mg total) by mouth at bedtime.  . [DISCONTINUED] nortriptyline (PAMELOR) 75 MG capsule Take 1 capsule (75 mg total) by mouth at bedtime.    Recent Results (from the past 2160 hour(s))  IPS PAP SMEAR ONLY     Status: None   Collection Time    05/20/14 12:00 AM      Result Value Ref Range   COMMENTS: [facility]     Comment: [address] [place], [place], [place] [address]     [address] [place], [place] [address]     GYN CYTOLOGY REPORT      PATIENT NAME:Anna Lindsey PATHOLOGY#:[id number]: F DOB: [birth year] (Age: 70) MEDICAL RECORD NUUVOZ:[id number] DOCTOR:Anna [facility], CNM DATE OBTAINED:6/15/2015CLIENT: [facility] DATE RECEIVED:6/16/2015OTHER PHYS: DATE SIGNED:05/22/2014     PAP- ThinLayer     Final Cytologic Interpretation:           Cervical, Thin Layer with Automated Imaging and Dual Review, CPT 88175          Negative for Intraepithelial Lesions or Malignancy.               ADEQUACY OF SPECIMEN:               Satisfactory for evaluation. Endocervical cells/transformation zone component identified.                     Electronically Signed Out  Bypal/6/17/2015PAL, CT(ASCP)[address], [place], [place]. Dir.: Mickle MalloryStephen J. SwangerThe Pap test is a screening mechanism with excellent but not perfect ability to prevent cervical carcinoma.  It has a low, but      significant, diagnostic error rate. The pap test is suboptimal  for detection of glandular lesions.  It should be noted that a negative result does not definitively rule out the presence of disease.Ref: DeMay, RM, The Art and Science of Cytopathology,      [employer], [id number].     Last Menstrual Period: 08/06/2000     Menstrual/Pregnancy History: Post-menopausal       Other Clinical Conditions: Other: LEEP      Physical Exam: Constitutional:  BP 146/77  Pulse 81  Ht 5' 1.5" (1.562 m)  Wt 114 lb (51.71 kg)  BMI 21.19 kg/m2  LMP 08/06/2000  Musculoskeletal: Strength & Muscle Tone: within normal limits Gait & Station: normal Patient leans: N/A  Mental Status Examination;  Patient is casually dressed and fairly groomed.  She is pleasant and cooperative.  Her speech is clear fluent and coherent.  Her thought process is logical and goal directed.  She described her mood anxious and her affect is mood appropriate.  She denies any auditory or visual hallucination.  Her fund of knowledge is adequate.  She denies any active or passive suicidal thoughts or homicidal thoughts.  There are no tremors or shakes.  There were no paranoia or delusion obsession present at this time.  She is alert and oriented x3.  Her insight judgment and impulse control is okay.   Established Problem, Stable/Improving (1), Review of Last Therapy Session (1) and Review of Medication Regimen & Side Effects (2)  Assessment: Axis I: Depressive disorder NOS  Axis II: Deferred  Axis III:  Past Medical History  Diagnosis Date  . Anxiety   . Depression   . Fibromyalgia   . Dysplasia of cervix, low grade (CIN 1) 1995    LEEP  . H/O: rheumatic fever   . Condyloma   . Hepatitis B     with complete  resolution- documentation - SAG  . Broken ankle     crushed heel 2013    Axis IV: Mild   Plan:  Patient is currently stable on her current psychotropic medication.  I offered counseling but patient declined.  I will continue Celexa and Pamelor at present dose.  Recommend to call us back if she feels worsening of the symptoms.  Followup in 3 months.  Luanne Krzyzanowski T., MD 07/04/2014

## 2014-07-13 ENCOUNTER — Other Ambulatory Visit: Payer: Self-pay | Admitting: Obstetrics & Gynecology

## 2014-07-15 ENCOUNTER — Telehealth: Payer: Self-pay | Admitting: Obstetrics & Gynecology

## 2014-07-15 DIAGNOSIS — N951 Menopausal and female climacteric states: Secondary | ICD-10-CM

## 2014-07-15 MED ORDER — ESTRADIOL 0.5 MG PO TABS: 0.5000 mg | ORAL_TABLET | Freq: Every day | ORAL | Status: DC

## 2014-07-15 NOTE — Telephone Encounter (Signed)
Huntley DecSara from [facility] called saying they do not have the rx below and wants to know if we can resend it  estradiol (ESTRACE) 0.5 MG tablet  Take 1 tablet (0.5 mg total) by mouth daily., Starting 05/20/2014, Until Discontinued, Normal, Last Dose: Not Recorded  Refills: 3 ordered Pharmacy: [id number] - [place], [place] - [address]

## 2014-07-15 NOTE — Telephone Encounter (Signed)
Last AEX: 05/20/14  Last refill:05/20/14 #90, 3 ref Current AEX: 05/26/15  Pt was given a new rx on 05/20/14 Encounter closed

## 2014-07-15 NOTE — Telephone Encounter (Signed)
[facility] [place] [place] for Estradiol 0.5 MG #90 3RF.  Routing to provider for final review. Patient agreeable to disposition. Will close encounter ;

## 2014-08-06 ENCOUNTER — Telehealth: Payer: Self-pay

## 2014-08-06 NOTE — Telephone Encounter (Signed)
Called patient to let her know estradiol tablet has been approved from prior [place]. Pt states her prometrium that was sent for 61yr was for at a time & it needed to be supply. I called [facility]) & told them it was okay for pt to do supply with refills for 1 yr

## 2014-08-07 ENCOUNTER — Telehealth: Payer: Self-pay | Admitting: *Deleted

## 2014-08-07 NOTE — Telephone Encounter (Signed)
Attempted to call patient's mobile number (shown as one of communication preferences) to schedule Annual Medicare Wellness Exam.

## 2014-08-15 ENCOUNTER — Telehealth: Payer: Self-pay | Admitting: *Deleted

## 2014-08-15 NOTE — Telephone Encounter (Signed)
Phoned and left voicemail message on patient's mobile number, leaving my name, title, and contact info & that I was calling to schedule her Annual Medicare Wellness Exam, as required by Medicare.  Gave her main number and my direct number.

## 2014-08-20 ENCOUNTER — Telehealth: Payer: Self-pay | Admitting: *Deleted

## 2014-08-20 NOTE — Telephone Encounter (Signed)
Patient returned my previous phone call yesterday.  Returned patient call, she was driving at the time, and will return my call when she can get to her calendar.

## 2014-08-20 NOTE — Telephone Encounter (Signed)
Patient phoned back & spoke with [facility] in billing.  Since patient had gynecology exam, PAF is being submitted with that OV info.

## 2014-09-03 ENCOUNTER — Other Ambulatory Visit: Payer: Self-pay | Admitting: Family Medicine

## 2014-09-03 DIAGNOSIS — Z1231 Encounter for screening mammogram for malignant neoplasm of breast: Secondary | ICD-10-CM

## 2014-09-11 ENCOUNTER — Ambulatory Visit (HOSPITAL_COMMUNITY)
Admission: RE | Admit: 2014-09-11 | Discharge: 2014-09-11 | Disposition: A | Discharge status: Home / Self Care | Payer: Medicare HMO | Source: Ambulatory Visit | Attending: Family Medicine | Admitting: Family Medicine

## 2014-09-11 ENCOUNTER — Other Ambulatory Visit: Payer: Self-pay | Admitting: Family Medicine

## 2014-09-11 DIAGNOSIS — Z1231 Encounter for screening mammogram for malignant neoplasm of breast: Secondary | ICD-10-CM

## 2014-09-16 ENCOUNTER — Other Ambulatory Visit (HOSPITAL_COMMUNITY): Payer: Self-pay | Admitting: Psychiatry

## 2014-09-17 ENCOUNTER — Other Ambulatory Visit (HOSPITAL_COMMUNITY): Payer: Self-pay | Admitting: Psychiatry

## 2014-10-07 ENCOUNTER — Ambulatory Visit (HOSPITAL_COMMUNITY): Payer: Self-pay | Admitting: Psychiatry

## 2014-10-07 ENCOUNTER — Encounter (HOSPITAL_COMMUNITY): Payer: Self-pay | Admitting: Psychiatry

## 2014-10-22 ENCOUNTER — Encounter (HOSPITAL_COMMUNITY): Payer: Self-pay | Admitting: Psychiatry

## 2014-10-22 ENCOUNTER — Ambulatory Visit (INDEPENDENT_AMBULATORY_CARE_PROVIDER_SITE_OTHER): Payer: Medicare HMO | Admitting: Psychiatry

## 2014-10-22 VITALS — BP 146/81 | HR 89 | Ht 61.5 in | Wt 113.0 lb

## 2014-10-22 DIAGNOSIS — F32A Depression, unspecified: Secondary | ICD-10-CM

## 2014-10-22 DIAGNOSIS — F329 Major depressive disorder, single episode, unspecified: Secondary | ICD-10-CM

## 2014-10-22 MED ORDER — NORTRIPTYLINE HCL 75 MG PO CAPS: 75.0000 mg | ORAL_CAPSULE | Freq: Every day | ORAL | Status: DC

## 2014-10-22 MED ORDER — CITALOPRAM HYDROBROMIDE 20 MG PO TABS: 20.0000 mg | ORAL_TABLET | Freq: Every day | ORAL | Status: DC

## 2014-10-22 NOTE — Progress Notes (Signed)
[facility] [id number] Progress Note  Anna CruzHope L Lindsey [id number] 68 y.o.  10/22/2014 2:13 PM  Chief Complaint:  Medication management and followup.  History of Present Illness:  Anna Lindsey came in for her follow up appointment.  She was last seen in August.  She is taking her Celexa and nortriptyline.  Her anxiety and depression is under control.  She is concerned because her husband lost her job.  Her husband is musician and  He was working at [employer].  Patient is concerned about financial strain.  Especially holidays are coming.  However she feels her current medicine is working very well.  She denies any insomnia, irritability, crying spells or any feeling of hopelessness or worthlessness.  Sometimes she gets frustrated when she is taking [place] of her 113 year old mother.  Her mother lives by herself but patient goes at least twice a week to see her.  Patient denied any side effects of medication.  She denies any major panic attack.  Patient lives with her husband.  Her appetite is okay.  She continues to go [employer] denies any weight gain or weight loss.  Her vitals are stable.  Patient does not drink or use any illegal substances.  She is scheduled to see Dr. Warner MccreedyJessica [facility] next week at [facility] for her physical and blood work.  Suicidal Ideation: No Plan Formed: No Patient has means to carry out plan: No  Homicidal Ideation: No Plan Formed: No Patient has means to carry out plan: No  Medical History; Patient has history of tonsillitis, arthritis, chronic headache, fibromyalgia and chronic fatigue.  Her primary [facility] physician is at [facility].   Past Psychiatric History/Hospitalization(s) She has depression since [id number].  She was seeing therapist in the office since 2012. She did not recall taking any antidepressants in the past until she started seeing psychiatrists in this office.  We have tried Lamictal however patient developed a rash and did not like it. Patient  denies any history of mania, psychosis, hallucinations, poor impulse control, aggression or violence.  Patient has no history of inpatient psychiatric treatment.  Review of Systems: Psychiatric: Agitation: No Hallucination: No Depressed Mood: No Insomnia: No Hypersomnia: No Altered Concentration: No Feels Worthless: No Grandiose Ideas: No Belief In Special Powers: No New/Increased Substance Abuse: No Compulsions: No  Neurologic: Headache: No Seizure: No Paresthesias: No    Outpatient Encounter Prescriptions as of 10/22/2014  Medication Sig  . aspirin 81 MG tablet Take 81 mg by mouth daily.  . Calcium Carbonate-Vitamin D (CALCIUM + D PO) Take 600 mg by mouth daily.  . citalopram (CELEXA) 20 MG tablet Take 1 tablet (20 mg total) by mouth at bedtime.  Marland Kitchen. estradiol (ESTRACE) 0.5 MG tablet Take 1 tablet (0.5 mg total) by mouth daily.  . Multiple Vitamin (MULTIVITAMIN) tablet Take 1 tablet by mouth daily.  . nortriptyline (PAMELOR) 75 MG capsule Take 1 capsule (75 mg total) by mouth at bedtime.  . progesterone (PROMETRIUM) 100 MG capsule Take 1 capsule (100 mg total) by mouth at bedtime.  . [DISCONTINUED] citalopram (CELEXA) 20 MG tablet Take 1 tablet (20 mg total) by mouth at bedtime.  . [DISCONTINUED] citalopram (CELEXA) 20 MG tablet TAKE 1 TABLET (20 MG TOTAL) BY MOUTH AT BEDTIME.  . [DISCONTINUED] nortriptyline (PAMELOR) 75 MG capsule Take 1 capsule (75 mg total) by mouth at bedtime.    Physical Exam: Constitutional:  BP 146/81 mmHg  Pulse 89  Ht 5' 1.5" (1.562 m)  Wt 113 lb (  51.256 kg)  BMI 21.01 kg/m2  LMP 08/06/2000  Musculoskeletal: Strength & Muscle Tone: within normal limits Gait & Station: normal Patient leans: N/A  Mental Status Examination;  Patient is casually dressed and fairly groomed.  She is pleasant and cooperative.  Her speech is clear fluent and coherent.  Her thought process is logical and goal directed.  She described her mood anxious and her affect  is mood appropriate.  She denies any auditory or visual hallucination.  Her fund of knowledge is adequate.  She denies any active or passive suicidal thoughts or homicidal thoughts.  There are no tremors or shakes.  There were no paranoia or delusion obsession present at this time.  She is alert and oriented x3.  Her insight judgment and impulse control is okay.   Established Problem, Stable/Improving (1), Review of Last Therapy Session (1) and Review of Medication Regimen & Side Effects (2)  Assessment: Axis I: Depressive disorder NOS  Axis II: Deferred  Axis III:  Past Medical History  Diagnosis Date  . Anxiety   . Depression   . Fibromyalgia   . Dysplasia of cervix, low grade (CIN 1) 1995    LEEP  . H/O: rheumatic fever   . Condyloma   . Hepatitis B     with complete resolution- documentation - SAG  . Broken ankle     crushed heel 2013    Axis IV: Mild   Plan:  Patient is currently stable on her current psychotropic medication.  I offered counseling but patient declined.  I will continue Celexa and Pamelor at present dose.  Recommend to call us back if she feels worsening of the symptoms.  Followup in 6 months.  Pristine Gladhill T., MD 10/22/2014

## 2014-10-28 ENCOUNTER — Encounter: Payer: Self-pay | Admitting: Family Medicine

## 2014-10-28 ENCOUNTER — Ambulatory Visit (INDEPENDENT_AMBULATORY_CARE_PROVIDER_SITE_OTHER): Payer: Medicare HMO | Admitting: Family Medicine

## 2014-10-28 VITALS — BP 118/71 | HR 79 | Temp 98.5°F | Resp 16 | Ht 61.0 in | Wt 109.0 lb

## 2014-10-28 DIAGNOSIS — Z23 Encounter for immunization: Secondary | ICD-10-CM

## 2014-10-28 DIAGNOSIS — Z13220 Encounter for screening for lipoid disorders: Secondary | ICD-10-CM

## 2014-10-28 DIAGNOSIS — Z0000 Encounter for general adult medical examination without abnormal findings: Secondary | ICD-10-CM

## 2014-10-28 DIAGNOSIS — Z130 Encounter for screening for diseases of the blood and blood-forming organs and certain disorders involving the immune mechanism: Secondary | ICD-10-CM

## 2014-10-28 DIAGNOSIS — Z131 Encounter for screening for diabetes mellitus: Secondary | ICD-10-CM

## 2014-10-28 DIAGNOSIS — Z1329 Encounter for screening for other suspected endocrine disorder: Secondary | ICD-10-CM

## 2014-10-28 DIAGNOSIS — I6521 Occlusion and stenosis of right carotid artery: Secondary | ICD-10-CM

## 2014-10-28 LAB — CBC WITH DIFFERENTIAL/PLATELET
BASOS PCT: 0 % (ref 0–1)
Basophils Absolute: 0 10*3/uL (ref 0.0–0.1)
Eosinophils Absolute: 0.4 10*3/uL (ref 0.0–0.7)
Eosinophils Relative: 7 % — ABNORMAL HIGH (ref 0–5)
HCT: 37.6 % (ref 36.0–46.0)
HEMOGLOBIN: 12.8 g/dL (ref 12.0–15.0)
LYMPHS ABS: 1.7 10*3/uL (ref 0.7–4.0)
Lymphocytes Relative: 31 % (ref 12–46)
MCH: 31.2 pg (ref 26.0–34.0)
MCHC: 34 g/dL (ref 30.0–36.0)
MCV: 91.7 fL (ref 78.0–100.0)
MONOS PCT: 7 % (ref 3–12)
MPV: 9.1 fL — AB (ref 9.4–12.4)
Monocytes Absolute: 0.4 10*3/uL (ref 0.1–1.0)
NEUTROS ABS: 3 10*3/uL (ref 1.7–7.7)
Neutrophils Relative %: 55 % (ref 43–77)
Platelets: 371 10*3/uL (ref 150–400)
RBC: 4.1 MIL/uL (ref 3.87–5.11)
RDW: 12.3 % (ref 11.5–15.5)
WBC: 5.4 10*3/uL (ref 4.0–10.5)

## 2014-10-28 LAB — TSH: TSH: 1.324 u[IU]/mL (ref 0.350–4.500)

## 2014-10-28 LAB — COMPREHENSIVE METABOLIC PANEL
ALBUMIN: 4.2 g/dL (ref 3.5–5.2)
ALT: 13 U/L (ref 0–35)
AST: 21 U/L (ref 0–37)
Alkaline Phosphatase: 66 U/L (ref 39–117)
BUN: 11 mg/dL (ref 6–23)
CO2: 29 mEq/L (ref 19–32)
Calcium: 9.5 mg/dL (ref 8.4–10.5)
Chloride: 101 mEq/L (ref 96–112)
Creat: 0.62 mg/dL (ref 0.50–1.10)
GLUCOSE: 77 mg/dL (ref 70–99)
POTASSIUM: 4.2 meq/L (ref 3.5–5.3)
SODIUM: 138 meq/L (ref 135–145)
TOTAL PROTEIN: 7 g/dL (ref 6.0–8.3)
Total Bilirubin: 0.5 mg/dL (ref 0.2–1.2)

## 2014-10-28 LAB — LIPID PANEL
Cholesterol: 203 mg/dL — ABNORMAL HIGH (ref 0–200)
HDL: 59 mg/dL (ref >39)
LDL Cholesterol: 131 mg/dL — ABNORMAL HIGH (ref 0–99)
Total CHOL/HDL Ratio: 3.4 Ratio
Triglycerides: 63 mg/dL (ref <150)
VLDL: 13 mg/dL (ref 0–40)

## 2014-10-28 NOTE — Patient Instructions (Addendum)
Great to see you today!  I will be in touch with your labs I will also get you referred to see vascular surgery to discuss your carotid artery issue.  They will instruct you as far as any additional follow-up.  However I think the main thing to do how is to continue exercise, healthy diet, watch your BP and monitor your cholesterol.   Keep an eye on your blood pressure but for now I would not start medication.  Let me know if you are consistently running high. You got your "prevnar 13" shot today Have a great holiday season

## 2014-10-28 NOTE — Progress Notes (Signed)
[facility] [address], [place] [address] [phone]  Date:  10/28/2014   Name:  Anna CruzHope L Lindsey   DOB:  1946-05-12   MRN:  [id number]  PCP:  Abbe AmsterdamOPLAND,Andree Heeg, MD    Chief Complaint: Annual Exam   History of Present Illness:  Anna L Jannifer FranklinSicola is a 68 y.o. very pleasant female patient who presents with the following:  Here today for a CPE. She has an OBG who handles her women's health concerns;  She sees Dr. Darcel BayleyLeonard.   She continues to care for her mother who is [id number].  Her husband lost his job.   She does not feel depressed, but is a bit anxious.  She sees Dr. Lolly MustacheArfeen for her psychiatric issues.   She is fasting since last night.  She has not yet had prevnar 13; pneumovax a year ago.  She would like to have a prevnar today  She went to a "health screening" and had a carotid doppler that showed moderate plaque on the right.  She is not sure what to do next with this result.   BP Readings from Last 3 Encounters:  10/28/14 118/71  10/22/14 146/81  07/04/14 146/77      Patient Active Problem List   Diagnosis Date Noted  . Depressive disorder, not elsewhere classified 01/16/2013    Past Medical History  Diagnosis Date  . Anxiety   . Depression   . Fibromyalgia   . Dysplasia of cervix, low grade (CIN 1) 1995    LEEP  . H/O: rheumatic fever   . Condyloma   . Hepatitis B     with complete resolution- documentation - [facility]AG  . Broken ankle     crushed heel 2013    Past Surgical History  Procedure Laterality Date  . Bladder surgery  1980  . Hysteroscopy  2004    D&C Polyp  . Exploratory laparotomy      Due to bladder injury with fall of a horse  . Tonsillectomy      childhood    History  Substance Use Topics  . Smoking status: Never Smoker   . Smokeless tobacco: Never Used  . Alcohol Use: 0.0 oz/week    0 Not specified per week    Family History  Problem Relation Age of Onset  . Anxiety disorder Mother   . Cancer Mother     skin  .  Thyroid disease Mother   . Heart disease Mother     afib  . Depression Father   . Colon cancer Father   . Hypertension Father   . Depression Sister   . Kidney disease Maternal Grandmother   . Parkinson's disease Maternal Grandfather   . Diabetes Paternal Grandmother   . Heart disease Paternal Grandfather     Allergies  Allergen Reactions  . Ciprofloxacin Anxiety    Medication list has been reviewed and updated.  Current Outpatient Prescriptions on File Prior to Visit  Medication Sig Dispense Refill  . aspirin 81 MG tablet Take 81 mg by mouth daily.    . Calcium Carbonate-Vitamin D (CALCIUM + D PO) Take 600 mg by mouth daily.    . citalopram (CELEXA) 20 MG tablet Take 1 tablet (20 mg total) by mouth at bedtime. 90 tablet 1  . estradiol (ESTRACE) 0.5 MG tablet Take 1 tablet (0.5 mg total) by mouth daily. 90 tablet 3  . Multiple Vitamin (MULTIVITAMIN) tablet Take 1 tablet by mouth daily.    . nortriptyline (  PAMELOR) 75 MG capsule Take 1 capsule (75 mg total) by mouth at bedtime. 90 capsule 1  . progesterone (PROMETRIUM) 100 MG capsule Take 1 capsule (100 mg total) by mouth at bedtime. 30 capsule 12   No current facility-administered medications on file prior to visit.    Review of Systems:  As per HPI- otherwise negative.   Physical Examination: Filed Vitals:   10/28/14 0945  BP: 118/71  Pulse: 79  Temp: 98.5 F (36.9 C)  Resp: 16   Filed Vitals:   10/28/14 0945  Height: 5\' 1"  (1.549 m)  Weight: 109 lb (49.442 kg)   Body mass index is 20.61 kg/(m^2). Ideal Body Weight: Weight in (lb) to have BMI = 25: 132  GEN: WDWN, NAD, Non-toxic, A & O x 3, slim build, looks well HEENT: Atraumatic, Normocephalic. Neck supple. No masses, No LAD.  Bilateral TM wnl, oropharynx normal.  PEERL,EOMI.   Ears and Nose: No external deformity. CV: RRR, No M/G/R. No JVD. No thrill. No extra heart sounds. PULM: CTA B, no wheezes, crackles, rhonchi. No retractions. No resp. distress. No  accessory muscle use. ABD: S, NT, ND. No rebound. No HSM. EXTR: No c/c/e NEURO Normal gait.  PSYCH: Normally interactive. Conversant. Not depressed or anxious appearing.  Calm demeanor.    Assessment and Plan: Physical exam  Need for prophylactic vaccination and inoculation against influenza - Plan: Flu Vaccine QUAD 36+ mos IM, Pneumococcal conjugate vaccine 13-valent IM  Screening for hyperlipidemia - Plan: Lipid panel  Carotid artery stenosis, right - Plan: Ambulatory referral to Vascular Surgery  Screening for diabetes mellitus - Plan: Comprehensive metabolic panel  Screening for deficiency anemia - Plan: CBC with Differential  Screening for hypothyroidism - Plan: TSH  Discussed how to optimize her health through monitoring her BP, chl, exercise,  She is a non-smoker Will refer to vascular surgery as she had a carotid doppler that showed some sort of blockage; the exact amount is unknown.  Discouraged her from having imaging "screening tests" unless advised by her doctor in the future as these tests can lead to more unnecessary testing or imaging .   Pt would like a copy of labs sent to her  Signed Abbe AmsterdamJessica Sharniece Gibbon, MD

## 2014-11-26 ENCOUNTER — Encounter: Payer: Self-pay | Admitting: Vascular Surgery

## 2015-03-11 ENCOUNTER — Encounter (HOSPITAL_COMMUNITY): Payer: Self-pay

## 2015-03-11 ENCOUNTER — Emergency Department (HOSPITAL_COMMUNITY)
Admission: EM | Admit: 2015-03-11 | Discharge: 2015-03-12 | Disposition: A | Discharge status: Home / Self Care | Payer: Medicare HMO | Attending: Emergency Medicine | Admitting: Emergency Medicine

## 2015-03-11 DIAGNOSIS — M797 Fibromyalgia: Secondary | ICD-10-CM | POA: Insufficient documentation

## 2015-03-11 DIAGNOSIS — Z79899 Other long term (current) drug therapy: Secondary | ICD-10-CM | POA: Insufficient documentation

## 2015-03-11 DIAGNOSIS — W260XXA Contact with knife, initial encounter: Secondary | ICD-10-CM | POA: Diagnosis not present

## 2015-03-11 DIAGNOSIS — Y9389 Activity, other specified: Secondary | ICD-10-CM | POA: Diagnosis not present

## 2015-03-11 DIAGNOSIS — Z793 Long term (current) use of hormonal contraceptives: Secondary | ICD-10-CM | POA: Diagnosis not present

## 2015-03-11 DIAGNOSIS — Z7982 Long term (current) use of aspirin: Secondary | ICD-10-CM | POA: Diagnosis not present

## 2015-03-11 DIAGNOSIS — F419 Anxiety disorder, unspecified: Secondary | ICD-10-CM | POA: Insufficient documentation

## 2015-03-11 DIAGNOSIS — Y998 Other external cause status: Secondary | ICD-10-CM | POA: Insufficient documentation

## 2015-03-11 DIAGNOSIS — F329 Major depressive disorder, single episode, unspecified: Secondary | ICD-10-CM | POA: Diagnosis not present

## 2015-03-11 DIAGNOSIS — Y9289 Other specified places as the place of occurrence of the external cause: Secondary | ICD-10-CM | POA: Insufficient documentation

## 2015-03-11 DIAGNOSIS — Z8619 Personal history of other infectious and parasitic diseases: Secondary | ICD-10-CM | POA: Diagnosis not present

## 2015-03-11 DIAGNOSIS — S61412A Laceration without foreign body of left hand, initial encounter: Secondary | ICD-10-CM | POA: Diagnosis present

## 2015-03-11 NOTE — ED Notes (Signed)
Pt presents with c/o right hand laceration that occurred approx 30 minutes ago. Pt reports she cut her hand with a serrated knife, center of her left palm, bleeding controlled at this time.

## 2015-03-11 NOTE — ED Provider Notes (Signed)
CSN: [id number]     Arrival date & time 03/11/15  2234 History  This chart was scribed for Antony Madura, PA-C, working with Purvis Sheffield, MD by Elon Spanner, ED Scribe. This patient was seen in room WTR8/WTR8 and the patient's care was started at 10:43 PM.   Chief Complaint  Patient presents with  . Hand Injury    The history is provided by the patient. No language interpreter was used.   HPI Comments: Anna Lindsey is a 69 y.o. female who presents to the Emergency Department complaining of a left hand stabbing laceration and mild associated pain sustained 40 minutes ago on the palm of her left hand.  The patient reports she was trying to stab the pit out of an avocado when she missed the avocado and entered the palm of her left hand with a serrated knife.  She reports the bleeding stop after she wrapped it with gauze but the bleeding recently recurred.  She has not taken anything for pain.  Patient denies use of anti-coagulants.  Patient is right handed.  Patient reports a tetanus booster within the past 4-5 years.  Patient denies numbness, weakness.   Past Medical History  Diagnosis Date  . Anxiety   . Depression   . Fibromyalgia   . Dysplasia of cervix, low grade (CIN 1) 1995    LEEP  . H/O: rheumatic fever   . Condyloma   . Hepatitis B     with complete resolution- documentation - [facility]  . Broken ankle     crushed heel 2013   Past Surgical History  Procedure Laterality Date  . Bladder surgery  1980  . Hysteroscopy  2004    D&C Polyp  . Exploratory laparotomy      Due to bladder injury with fall of a horse  . Tonsillectomy      childhood   Family History  Problem Relation Age of Onset  . Anxiety disorder Mother   . Cancer Mother     skin  . Thyroid disease Mother   . Heart disease Mother     afib  . Depression Father   . Colon cancer Father   . Hypertension Father   . Depression Sister   . Kidney disease Maternal Grandmother   . Parkinson's disease Maternal  Grandfather   . Diabetes Paternal Grandmother   . Heart disease Paternal Grandfather    History  Substance Use Topics  . Smoking status: Never Smoker   . Smokeless tobacco: Never Used  . Alcohol Use: 0.0 oz/week    0 Standard drinks or equivalent per week   OB History    Gravida Para Term Preterm AB TAB SAB Ectopic Multiple Living   Review of Systems  Skin: Positive for wound.  Neurological: Negative for weakness and numbness.    Allergies  Ciprofloxacin  Home Medications   Prior to Admission medications   Medication Sig Start Date End Date Taking? Authorizing Provider  aspirin 81 MG tablet Take 81 mg by mouth daily.   Yes Historical Provider, MD  Calcium Carbonate-Vitamin D (CALCIUM + D PO) Take 600 mg by mouth daily.   Yes Historical Provider, MD  citalopram (CELEXA) 20 MG tablet Take 1 tablet (20 mg total) by mouth at bedtime. 10/22/14  Yes Cleotis Nipper, MD  estradiol (ESTRACE) 0.5 MG tablet Take 1 tablet (0.5 mg total) by mouth daily. 07/15/14  Yes  Verner Choleborah S Leonard, CNM  Multiple Vitamin (MULTIVITAMIN) tablet Take 1 tablet by mouth daily.   Yes Historical Provider, MD  nortriptyline (PAMELOR) 75 MG capsule Take 1 capsule (75 mg total) by mouth at bedtime. 10/22/14  Yes Cleotis NipperSyed T Arfeen, MD  progesterone (PROMETRIUM) 100 MG capsule Take 1 capsule (100 mg total) by mouth at bedtime. 05/20/14  Yes Verner Choleborah S Leonard, CNM   BP 160/70 mmHg  Pulse 77  Temp(Src) 97.4 F (36.3 C) (Oral)  Resp 14  SpO2 100%  LMP 08/06/2000   Physical Exam  Constitutional: She is oriented to person, place, and time. She appears well-developed and well-nourished. No distress.  HENT:  Head: Normocephalic and atraumatic.  Eyes: Conjunctivae and EOM are normal. No scleral icterus.  Neck: Normal range of motion.  Cardiovascular: Normal rate, regular rhythm and intact distal pulses.   Distal radial pulse 2+ in the left upper extremity. Capillary refill brisk in all digits of  left hand.  Pulmonary/Chest: Effort normal. No respiratory distress.  Respirations even and unlabored.  Musculoskeletal: Normal range of motion.       Left hand: She exhibits laceration. She exhibits normal range of motion, no tenderness, no bony tenderness and normal capillary refill. Normal sensation noted. Normal strength noted.       Hands: Neurological: She is alert and oriented to person, place, and time. She exhibits normal muscle tone. Coordination normal.  Sensation to light touch intact in all digits of left hand. Finger to thumb opposition intact. Grip strength in the left hand is 5/5.  Skin: Skin is warm and dry. No rash noted. She is not diaphoretic. No erythema. No pallor.  Psychiatric: She has a normal mood and affect. Her behavior is normal.  Nursing note and vitals reviewed.   ED Course  Procedures (including critical care time)  DIAGNOSTIC STUDIES: Oxygen Saturation is 100% on RA, normal by my interpretation.    COORDINATION OF CARE:  10:50 PM Discussed treatment plan with patient at bedside.  Patient acknowledges and agrees with plan.    Labs Review Labs Reviewed - No data to display  Imaging Review No results found.   EKG Interpretation None      LACERATION REPAIR Performed by: Antony MaduraHUMES, Berenise Hunton Authorized by: Antony MaduraHUMES, Fayette Hamada Consent: Verbal consent obtained. Risks and benefits: risks, benefits and alternatives were discussed Consent given by: patient Patient identity confirmed: provided demographic data Prepped and Draped in normal sterile fashion Wound explored  Laceration Location: palmar aspect of L hand  Laceration Length: 1cm  No Foreign Bodies seen or palpated  Anesthesia: local infiltration  Local anesthetic: lidocaine 1% without epinephrine  Anesthetic total: 3 ml  Irrigation method: syringe Amount of cleaning: standard  Skin closure: 5-0 ethilon  Number of sutures: 1  Technique: horizontal mattress  Patient tolerance: Patient  tolerated the procedure well with no immediate complications.  MDM   Final diagnoses:  Hand laceration, left, initial encounter    Tdap booster UTD. Patient neurovascularly intact. Pressure irrigation performed. Laceration occurred < 8 hours prior to repair which was well tolerated. Pt has no comorbidities to effect normal wound healing. Discussed suture home care with pt and answered questions. Pt to follow up for wound check and suture removal in 10-12 days. Return precautions discussed and provided. Patient agreeable to plan with no unaddressed concerns.  I personally performed the services described in this documentation, which was scribed in my presence. The recorded information has been reviewed and is accurate.   Filed Vitals:  03/11/15 2237  BP: 160/70  Pulse: 77  Temp: 97.4 F (36.3 C)  TempSrc: Oral  Resp: 14  SpO2: 100%     Antony Madura, PA-C 03/12/15 0015  Purvis Sheffield, MD 03/12/15 [phone]

## 2015-03-12 NOTE — ED Notes (Signed)
Pt being sutured

## 2015-03-12 NOTE — Discharge Instructions (Signed)

## 2015-03-21 ENCOUNTER — Ambulatory Visit (INDEPENDENT_AMBULATORY_CARE_PROVIDER_SITE_OTHER): Payer: Medicare HMO | Admitting: Internal Medicine

## 2015-03-21 VITALS — BP 120/80 | HR 86 | Temp 98.5°F | Resp 16 | Ht 61.75 in | Wt 113.4 lb

## 2015-03-21 DIAGNOSIS — S61432D Puncture wound without foreign body of left hand, subsequent encounter: Secondary | ICD-10-CM
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
## 2015-03-26 ENCOUNTER — Other Ambulatory Visit (HOSPITAL_COMMUNITY): Payer: Self-pay | Admitting: Cardiology

## 2015-03-26 DIAGNOSIS — R079 Chest pain, unspecified: Secondary | ICD-10-CM

## 2015-04-02 ENCOUNTER — Encounter (HOSPITAL_COMMUNITY)
Admission: RE | Admit: 2015-04-02 | Discharge: 2015-04-02 | Disposition: A | Discharge status: Home / Self Care | Payer: Medicare HMO | Source: Ambulatory Visit | Attending: Cardiology | Admitting: Cardiology

## 2015-04-02 ENCOUNTER — Encounter (HOSPITAL_COMMUNITY): Payer: Medicare HMO

## 2015-04-02 DIAGNOSIS — R079 Chest pain, unspecified: Secondary | ICD-10-CM | POA: Insufficient documentation

## 2015-04-02 LAB — HEPATIC FUNCTION PANEL
ALK PHOS: 58 U/L (ref 39–117)
ALT: 18 U/L (ref 0–35)
AST: 24 U/L (ref 0–37)
Albumin: 3.7 g/dL (ref 3.5–5.2)
Total Bilirubin: 0.1 mg/dL — ABNORMAL LOW (ref 0.3–1.2)
Total Protein: 6.7 g/dL (ref 6.0–8.3)

## 2015-04-02 LAB — BASIC METABOLIC PANEL
ANION GAP: 9 (ref 5–15)
BUN: 9 mg/dL (ref 6–23)
CHLORIDE: 100 mmol/L (ref 96–112)
CO2: 28 mmol/L (ref 19–32)
Calcium: 8.8 mg/dL (ref 8.4–10.5)
Creatinine, Ser: 0.57 mg/dL (ref 0.50–1.10)
GFR calc Af Amer: >90 mL/min (ref >90)
GFR calc non Af Amer: >90 mL/min (ref >90)
Glucose, Bld: 87 mg/dL (ref 70–99)
Potassium: 3.9 mmol/L (ref 3.5–5.1)
Sodium: 137 mmol/L (ref 135–145)

## 2015-04-02 LAB — LIPID PANEL
CHOL/HDL RATIO: 3.7 ratio
Cholesterol: 216 mg/dL — ABNORMAL HIGH (ref 0–200)
HDL: 58 mg/dL (ref >39)
LDL Cholesterol: 146 mg/dL — ABNORMAL HIGH (ref 0–99)
Triglycerides: 60 mg/dL (ref <150)
VLDL: 12 mg/dL (ref 0–40)

## 2015-04-02 MED ORDER — TECHNETIUM TC 99M SESTAMIBI GENERIC - CARDIOLITE
30.0000 | Freq: Once | INTRAVENOUS | Status: AC | PRN
  Administered 2015-04-02: 30 via INTRAVENOUS

## 2015-04-02 MED ORDER — TECHNETIUM TC 99M SESTAMIBI GENERIC - CARDIOLITE
10.0000 | Freq: Once | INTRAVENOUS | Status: AC | PRN
  Administered 2015-04-02: 10 via INTRAVENOUS

## 2015-04-02 MED ORDER — REGADENOSON 0.4 MG/5ML IV SOLN
INTRAVENOUS | Status: AC
  Filled 2015-04-02: qty 5

## 2015-04-06 ENCOUNTER — Emergency Department (HOSPITAL_COMMUNITY)
Admission: EM | Admit: 2015-04-06 | Discharge: 2015-04-06 | Disposition: A | Discharge status: Home / Self Care | Payer: Medicare HMO | Attending: Emergency Medicine | Admitting: Emergency Medicine

## 2015-04-06 ENCOUNTER — Encounter (HOSPITAL_COMMUNITY): Payer: Self-pay | Admitting: Emergency Medicine

## 2015-04-06 DIAGNOSIS — F419 Anxiety disorder, unspecified: Secondary | ICD-10-CM | POA: Insufficient documentation

## 2015-04-06 DIAGNOSIS — Z8619 Personal history of other infectious and parasitic diseases: Secondary | ICD-10-CM | POA: Diagnosis not present

## 2015-04-06 DIAGNOSIS — Z79899 Other long term (current) drug therapy: Secondary | ICD-10-CM | POA: Diagnosis not present

## 2015-04-06 DIAGNOSIS — L03115 Cellulitis of right lower limb: Secondary | ICD-10-CM | POA: Insufficient documentation

## 2015-04-06 DIAGNOSIS — Z8739 Personal history of other diseases of the musculoskeletal system and connective tissue: Secondary | ICD-10-CM | POA: Insufficient documentation

## 2015-04-06 DIAGNOSIS — L299 Pruritus, unspecified: Secondary | ICD-10-CM | POA: Diagnosis present

## 2015-04-06 DIAGNOSIS — Z8742 Personal history of other diseases of the female genital tract: Secondary | ICD-10-CM | POA: Insufficient documentation

## 2015-04-06 DIAGNOSIS — F329 Major depressive disorder, single episode, unspecified: Secondary | ICD-10-CM | POA: Insufficient documentation

## 2015-04-06 DIAGNOSIS — Z7982 Long term (current) use of aspirin: Secondary | ICD-10-CM | POA: Insufficient documentation

## 2015-04-06 DIAGNOSIS — Z8781 Personal history of (healed) traumatic fracture: Secondary | ICD-10-CM | POA: Diagnosis not present

## 2015-04-06 MED ORDER — CEPHALEXIN 500 MG PO CAPS: 500.0000 mg | ORAL_CAPSULE | Freq: Two times a day (BID) | ORAL | Status: DC

## 2015-04-06 NOTE — ED Provider Notes (Signed)
CSN: [id number]     Arrival date & time 04/06/15  2224 History   First MD Initiated Contact with Patient 04/06/15 2227     This chart was scribed for non-physician practitioner, Junius Finner PA-C, working with No att. providers found by Arlan Organ, ED Scribe. This patient was seen in room WTR8/WTR8 and the patient's care was started at 10:38 PM.   Chief Complaint  Patient presents with  . Insect Bite   The history is provided by the patient. No language interpreter was used.    HPI Comments: Anna Lindsey is a 69 y.o. female with a PMHx of anxiety and depression who presents to the Emergency Department here after a possible insect bite to the R inner thigh sustained yesterday. Pt states she suspects she ws bitten or stung but an unidentified insect yesterday. She has now noticed a pruritis area of redness to area that has progressively worsened in size since onset. Ms. Germani has tried OTC Hydrocortisone topical ointment without any improvement for symptoms. No recent fever, nausea, or vomiting. No history of reactions to insects bites or stings.  Past Medical History  Diagnosis Date  . Anxiety   . Depression   . Fibromyalgia   . Dysplasia of cervix, low grade (CIN 1) 1995    LEEP  . H/O: rheumatic fever   . Condyloma   . Hepatitis B     with complete resolution- documentation - SAG  . Broken ankle     crushed heel 2013   Past Surgical History  Procedure Laterality Date  . Bladder surgery  1980  . Hysteroscopy  2004    D&C Polyp  . Exploratory laparotomy      Due to bladder injury with fall of a horse  . Tonsillectomy      childhood   Family History  Problem Relation Age of Onset  . Anxiety disorder Mother   . Cancer Mother     skin  . Thyroid disease Mother   . Heart disease Mother     afib  . Depression Father   . Colon cancer Father   . Hypertension Father   . Depression Sister   . Kidney disease Maternal Grandmother   . Parkinson's disease Maternal Grandfather    . Diabetes Paternal Grandmother   . Heart disease Paternal Grandfather    History  Substance Use Topics  . Smoking status: Never Smoker   . Smokeless tobacco: Never Used  . Alcohol Use: 0.0 oz/week    0 Standard drinks or equivalent per week   OB History    Gravida Para Term Preterm AB TAB SAB Ectopic Multiple Living   Review of Systems  Constitutional: Negative for fever and chills.  Respiratory: Negative for shortness of breath.   Gastrointestinal: Negative for nausea and vomiting.  Skin: Positive for color change. Negative for rash and wound.  Psychiatric/Behavioral: Negative for confusion.      Allergies  Ciprofloxacin  Home Medications   Prior to Admission medications   Medication Sig Start Date End Date Taking? Authorizing Provider  aspirin 81 MG tablet Take 81 mg by mouth daily.    Historical Provider, MD  Calcium Carbonate-Vitamin D (CALCIUM + D PO) Take 600 mg by mouth daily.    Historical Provider, MD  cephALEXin (KEFLEX) 500 MG capsule Take 1 capsule (500 mg total) by mouth 2 (two) times daily. 04/06/15   Junius Finner, PA-C  citalopram (CELEXA) 20 MG tablet Take 1 tablet (20 mg total) by mouth at bedtime. 10/22/14   Cleotis NipperSyed T Arfeen, MD  estradiol (ESTRACE) 0.5 MG tablet Take 1 tablet (0.5 mg total) by mouth daily. 07/15/14   Verner Choleborah S Leonard, CNM  Multiple Vitamin (MULTIVITAMIN) tablet Take 1 tablet by mouth daily.    Historical Provider, MD  nortriptyline (PAMELOR) 75 MG capsule Take 1 capsule (75 mg total) by mouth at bedtime. 10/22/14   Cleotis NipperSyed T Arfeen, MD  progesterone (PROMETRIUM) 100 MG capsule Take 1 capsule (100 mg total) by mouth at bedtime. 05/20/14   Verner Choleborah S Leonard, CNM   Triage Vitals: BP 136/74 mmHg  Pulse 76  Temp(Src) 98.6 F (37 C) (Oral)  Resp 20  SpO2 99%  LMP 08/06/2000   Physical Exam  Constitutional: She is oriented to person, place, and time. She appears well-developed and well-nourished.  HENT:  Head:  Normocephalic and atraumatic.  Eyes: EOM are normal.  Neck: Normal range of motion.  Cardiovascular: Normal rate.   Pulmonary/Chest: Effort normal.  Musculoskeletal: Normal range of motion.  Neurological: She is alert and oriented to person, place, and time.  Skin: Skin is warm and dry. No rash noted. There is erythema.  8x7 cm area of erythema to the distal medial aspect of R thigh Mild tenderness noted No induration or fluctuance No discharge or bleeding  Psychiatric: She has a normal mood and affect. Her behavior is normal.  Nursing note and vitals reviewed.   ED Course  Procedures (including critical care time)  DIAGNOSTIC STUDIES: Oxygen Saturation is 99% on RA, Normal by my interpretation.    COORDINATION OF CARE: 10:38 PM- Will discharge home with course of antibiotic. Discussed treatment plan with pt at bedside and pt agreed to plan.     Labs Review Labs Reviewed - No data to display  Imaging Review No results found.   EKG Interpretation None      MDM   Final diagnoses:  Cellulitis of right thigh    Signs and symptoms c/w mild cellulitis of Right medial thigh. No evidence of underlying abscess at this time. Will start pt on keflex, home care instructions provided. Advised to f/u in 3-4 days to ensure proper healing. Return precautions provided. Pt verbalized understanding and agreement with tx plan.   I personally performed the services described in this documentation, which was scribed in my presence. The recorded information has been reviewed and is accurate.   Junius Finnerrin O'Malley, PA-C 04/06/15 2311  Doug SouSam Jacubowitz, MD 04/06/15 [phone]

## 2015-04-06 NOTE — ED Notes (Signed)
PA at bedside.

## 2015-04-06 NOTE — ED Notes (Addendum)
Pt from home c/o possible insect bite to right inner thigh that occurred yesterday. Area is red and is itching. She reports what ever stung her stung her several times. Pt reports using hydrocortisone cream at home. Pt denies fevers or any other areas like such. Area marked to monitor redness.

## 2015-04-22 ENCOUNTER — Ambulatory Visit (HOSPITAL_COMMUNITY): Payer: Self-pay | Admitting: Psychiatry

## 2015-05-08 ENCOUNTER — Ambulatory Visit (INDEPENDENT_AMBULATORY_CARE_PROVIDER_SITE_OTHER): Payer: Medicare HMO | Admitting: Psychiatry

## 2015-05-08 ENCOUNTER — Encounter (HOSPITAL_COMMUNITY): Payer: Self-pay | Admitting: Psychiatry

## 2015-05-08 VITALS — BP 130/74 | HR 81 | Ht 60.0 in | Wt 115.4 lb

## 2015-05-08 DIAGNOSIS — F32A Depression, unspecified: Secondary | ICD-10-CM

## 2015-05-08 DIAGNOSIS — F329 Major depressive disorder, single episode, unspecified: Secondary | ICD-10-CM

## 2015-05-08 MED ORDER — NORTRIPTYLINE HCL 75 MG PO CAPS: 75.0000 mg | ORAL_CAPSULE | Freq: Every day | ORAL | Status: DC

## 2015-05-08 MED ORDER — CITALOPRAM HYDROBROMIDE 20 MG PO TABS: 20.0000 mg | ORAL_TABLET | Freq: Every day | ORAL | Status: DC

## 2015-05-08 NOTE — Progress Notes (Signed)
[facility] [id number] Progress Note  Anna Lindsey [id number] 69 y.o.  05/08/2015 4:29 PM  Chief Complaint:  Medication management and followup.  History of Present Illness:  Anna Lindsey came in for her follow up appointment.  She was last seen in November.  She is taking her medication as prescribed.  Sometimes she gets frustrated because of her mother.  Her mother is now 68 years old.  She is caretaker and sometime she feels burnout.  Lately she's been sleeping too much because she gets easily tired.  However she denies any depression, irritability, crying spells, feeling hopelessness or worthlessness.  She has no tremors or shakes.  She continues to attend support groups and continues to [facility] .  Her appetite is okay.  Her vitals are stable.  Patient denies drinking or using any illegal substances.  Suicidal Ideation: No Plan Formed: No Patient has means to carry out plan: No  Homicidal Ideation: No Plan Formed: No Patient has means to carry out plan: No  Medical History; Patient has history of tonsillitis, arthritis, chronic headache, fibromyalgia and chronic fatigue.  Her primary care physician is at [facility].   Past Psychiatric History/Hospitalization(s) She has depression since 461980s.  She was seeing therapist in the office since 2012. She did not recall taking any antidepressants in the past until she started seeing psychiatrists in this office.  We have tried Lamictal however patient developed a rash and did not like it. Patient denies any history of mania, psychosis, hallucinations, poor impulse control, aggression or violence.  Patient has no history of inpatient psychiatric treatment.  Review of Systems: Psychiatric: Agitation: No Hallucination: No Depressed Mood: No Insomnia: No Hypersomnia: No Altered Concentration: No Feels Worthless: No Grandiose Ideas: No Belief In Special Powers: No New/Increased Substance Abuse: No Compulsions:  No  Neurologic: Headache: No Seizure: No Paresthesias: No    Outpatient Encounter Prescriptions as of 05/08/2015  Medication Sig  . aspirin 81 MG tablet Take 81 mg by mouth daily.  Marland Kitchen. atorvastatin (LIPITOR) 40 MG tablet Take 40 mg by mouth daily.  . Calcium Carbonate-Vitamin D (CALCIUM + D PO) Take 600 mg by mouth daily.  . citalopram (CELEXA) 20 MG tablet Take 1 tablet (20 mg total) by mouth at bedtime.  Marland Kitchen. estradiol (ESTRACE) 0.5 MG tablet Take 1 tablet (0.5 mg total) by mouth daily.  . Multiple Vitamin (MULTIVITAMIN) tablet Take 1 tablet by mouth daily.  . nortriptyline (PAMELOR) 75 MG capsule Take 1 capsule (75 mg total) by mouth at bedtime.  . progesterone (PROMETRIUM) 100 MG capsule Take 1 capsule (100 mg total) by mouth at bedtime.  . [DISCONTINUED] cephALEXin (KEFLEX) 500 MG capsule Take 1 capsule (500 mg total) by mouth 2 (two) times daily.  . [DISCONTINUED] citalopram (CELEXA) 20 MG tablet Take 1 tablet (20 mg total) by mouth at bedtime.  . [DISCONTINUED] nortriptyline (PAMELOR) 75 MG capsule Take 1 capsule (75 mg total) by mouth at bedtime.   No facility-administered encounter medications on file as of 05/08/2015.    Physical Exam: Constitutional:  BP 130/74 mmHg  Pulse 81  Ht 5' (1.524 m)  Wt 115 lb 6.4 oz (52.345 kg)  BMI 22.54 kg/m2  LMP 08/06/2000  Musculoskeletal: Strength & Muscle Tone: within normal limits Gait & Station: normal Patient leans: N/A  Mental Status Examination;  Patient is casually dressed and fairly groomed.  She is pleasant and cooperative.  Her speech is clear fluent and coherent.  Her thought process is  logical and goal directed.  She described her mood anxious and her affect is mood appropriate.  She denies any auditory or visual hallucination.  Her fund of knowledge is adequate.  She denies any active or passive suicidal thoughts or homicidal thoughts.  There are no tremors or shakes.  There were no paranoia or delusion obsession present at  this time.  She is alert and oriented x3.  Her insight judgment and impulse control is okay.   Established Problem, Stable/Improving (1), Review of Last Therapy Session (1) and Review of Medication Regimen & Side Effects (2)  Assessment: Axis I: Depressive disorder NOS  Axis II: Deferred  Axis III:  Past Medical History  Diagnosis Date  . Anxiety   . Depression   . Fibromyalgia   . Dysplasia of cervix, low grade (CIN 1) 1995    LEEP  . H/O: rheumatic fever   . Condyloma   . Hepatitis B     with complete resolution- documentation - [facility]  . Broken ankle     crushed heel 2013    Plan:  Patient is currently stable on her current psychotropic medication.  I offered counseling but patient declined.  I will continue Celexa 20 mg daily and Pamelor 75 mg daily .  Discussed medication side effects and benefits.  Recommend to call us back if she feels worsening of the symptoms.  Followup in 6 months.  Lennell Shanks T., MD 05/08/2015

## 2015-05-15 ENCOUNTER — Ambulatory Visit (INDEPENDENT_AMBULATORY_CARE_PROVIDER_SITE_OTHER): Payer: Medicare HMO | Admitting: Emergency Medicine

## 2015-05-15 VITALS — BP 138/70 | HR 88 | Temp 98.4°F | Resp 18 | Ht 60.5 in | Wt 115.0 lb

## 2015-05-15 DIAGNOSIS — S60561A Insect bite (nonvenomous) of right hand, initial encounter: Secondary | ICD-10-CM | POA: Diagnosis not present

## 2015-05-15 DIAGNOSIS — W57XXXA Bitten or stung by nonvenomous insect and other nonvenomous arthropods, initial encounter: Secondary | ICD-10-CM | POA: Diagnosis not present

## 2015-05-15 NOTE — Patient Instructions (Signed)

## 2015-05-15 NOTE — Progress Notes (Signed)
Subjective:  Patient ID: Anna Lindsey, female    DOB: Dec 28, 1945  Age: 69 y.o. MRN: [id number]  CC: Insect Bite   HPI [name] presents  patient was stung by an insect on her right hand 3 hours ago. While she was outside. She has no systemic symptoms suggestive of anaphylaxis including generalized rash hives hoarseness or shortness of breath. No wheezing. Does have an erythematous area right wrist. She has done nothing to alleviate the discomfort and has taken no medication. She has no history of allergy in 6 to   Outpatient Prescriptions Prior to Visit  Medication Sig Dispense Refill  . aspirin 81 MG tablet Take 81 mg by mouth daily.    Marland Kitchen atorvastatin (LIPITOR) 40 MG tablet Take 40 mg by mouth daily.    . Calcium Carbonate-Vitamin D (CALCIUM + D PO) Take 600 mg by mouth daily.    . citalopram (CELEXA) 20 MG tablet Take 1 tablet (20 mg total) by mouth at bedtime. 90 tablet 1  . estradiol (ESTRACE) 0.5 MG tablet Take 1 tablet (0.5 mg total) by mouth daily. 90 tablet 3  . Multiple Vitamin (MULTIVITAMIN) tablet Take 1 tablet by mouth daily.    . nortriptyline (PAMELOR) 75 MG capsule Take 1 capsule (75 mg total) by mouth at bedtime. 90 capsule 1  . progesterone (PROMETRIUM) 100 MG capsule Take 1 capsule (100 mg total) by mouth at bedtime. 30 capsule 12   No facility-administered medications prior to visit.    History   Social History  . Marital Status: Married    Spouse Name: N/A  . Number of Children: N/A  . Years of Education: N/A   Social History Main Topics  . Smoking status: Never Smoker   . Smokeless tobacco: Never Used  . Alcohol Use: 0.0 oz/week    0 Standard drinks or equivalent per week  . Drug Use: No  . Sexual Activity: No   Other Topics Concern  . None   Social History Narrative   Exercise stretching once a day for 5 minutes    Family History  Problem Relation Age of Onset  . Anxiety disorder Mother   . Cancer Mother     skin  . Thyroid disease  Mother   . Heart disease Mother     afib  . Depression Father   . Colon cancer Father   . Hypertension Father   . Depression Sister   . Kidney disease Maternal Grandmother   . Parkinson's disease Maternal Grandfather   . Diabetes Paternal Grandmother   . Heart disease Paternal Grandfather     Past Medical History  Diagnosis Date  . Anxiety   . Depression   . Fibromyalgia   . Dysplasia of cervix, low grade (CIN 1) 1995    LEEP  . H/O: rheumatic fever   . Condyloma   . Hepatitis B     with complete resolution- documentation - [facility]  . Broken ankle     crushed heel 2013     Review of Systems  Constitutional: Negative for fever, chills and appetite change.  HENT: Negative for congestion, ear pain, postnasal drip, sinus pressure and sore throat.   Eyes: Negative for pain and redness.  Respiratory: Negative for cough, shortness of breath and wheezing.   Cardiovascular: Negative for leg swelling.  Gastrointestinal: Negative for nausea, vomiting, abdominal pain, diarrhea, constipation and blood in stool.  Endocrine: Negative for polyuria.  Genitourinary: Negative for dysuria, urgency, frequency and flank pain.  Musculoskeletal: Negative for gait problem.  Skin: Negative for rash.  Neurological: Negative for weakness and headaches.  Psychiatric/Behavioral: Negative for confusion and decreased concentration. The patient is not nervous/anxious.     Objective:  BP 138/70 mmHg  Pulse 88  Temp(Src) 98.4 F (36.9 C) (Oral)  Resp 18  Ht 5' 0.5" (1.537 m)  Wt 115 lb (52.164 kg)  BMI 22.08 kg/m2  SpO2 98%  LMP 08/06/2000  BP Readings from Last 3 Encounters:  05/15/15 138/70  05/08/15 130/74  04/06/15 136/74    Wt Readings from Last 3 Encounters:  05/15/15 115 lb (52.164 kg)  05/08/15 115 lb 6.4 oz (52.345 kg)  03/21/15 113 lb 6 oz (51.427 kg)    Physical Exam  Constitutional: She is oriented to person, place, and time. She appears well-developed and well-nourished.    HENT:  Head: Normocephalic and atraumatic.  Eyes: Conjunctivae are normal. Pupils are equal, round, and reactive to light.  Pulmonary/Chest: Effort normal.  Musculoskeletal: She exhibits no edema.  Neurological: She is alert and oriented to person, place, and time.  Skin: Skin is dry. There is erythema.  Psychiatric: She has a normal mood and affect. Her behavior is normal. Thought content normal.    Lab Results  Component Value Date   WBC 5.4 10/28/2014   HGB 12.8 10/28/2014   HCT 37.6 10/28/2014   PLT 371 10/28/2014   GLUCOSE 87 04/02/2015   CHOL 216* 04/02/2015   TRIG 60 04/02/2015   HDL 58 04/02/2015   LDLCALC 146* 04/02/2015   ALT 18 04/02/2015   AST 24 04/02/2015   NA 137 04/02/2015   K 3.9 04/02/2015   CL 100 04/02/2015   CREATININE 0.57 04/02/2015   BUN 9 04/02/2015   CO2 28 04/02/2015   TSH 1.324 10/28/2014      .  Assessment & Plan:   Sakira was seen today for insect bite.  Diagnoses and all orders for this visit:  Insect bite   I am having Ms. Ramser maintain her multivitamin, aspirin, Calcium Carbonate-Vitamin D (CALCIUM + D PO), progesterone, estradiol, atorvastatin, nortriptyline, and citalopram.  No orders of the defined types were placed in this encounter.   I suggested the patient take Benadryl for the allergic reaction as well as using her previously prescribed steroidal cream as well as following up in the emergency room here for worsening symptoms.   Appropriate red flag conditions were discussed with the patient as well as actions that should be taken.  Patient expressed his understanding.  Follow-up: Return if symptoms worsen or fail to improve.  Carmelina Dane, MD

## 2015-05-26 ENCOUNTER — Ambulatory Visit: Payer: Medicare HMO | Admitting: Certified Nurse Midwife

## 2015-06-12 ENCOUNTER — Ambulatory Visit (INDEPENDENT_AMBULATORY_CARE_PROVIDER_SITE_OTHER): Payer: Medicare HMO | Admitting: Certified Nurse Midwife

## 2015-06-12 ENCOUNTER — Encounter: Payer: Self-pay | Admitting: Certified Nurse Midwife

## 2015-06-12 VITALS — BP 126/62 | HR 70 | Resp 16 | Ht 61.25 in | Wt 114.0 lb

## 2015-06-12 DIAGNOSIS — Z01419 Encounter for gynecological examination (general) (routine) without abnormal findings: Secondary | ICD-10-CM

## 2015-06-12 DIAGNOSIS — Z124 Encounter for screening for malignant neoplasm of cervix: Secondary | ICD-10-CM | POA: Diagnosis not present

## 2015-06-12 NOTE — Patient Instructions (Signed)
EXERCISE AND DIET:  We recommended that you start or continue a regular exercise program for good health. Regular exercise means any activity that makes your heart beat faster and makes you sweat.  We recommend exercising at least 30 minutes per day at least 3 days a week, preferably 4 or 5.  We also recommend a diet low in fat and sugar.  Inactivity, poor dietary choices and obesity can cause diabetes, heart attack, stroke, and kidney damage, among others.    ALCOHOL AND SMOKING:  Women should limit their alcohol intake to no more than 7 drinks/beers/glasses of wine (combined, not each!) per week. Moderation of alcohol intake to this level decreases your risk of breast cancer and liver damage. And of course, no recreational drugs are part of a healthy lifestyle.  And absolutely no smoking or even second hand smoke. Most people know smoking can cause heart and lung diseases, but did you know it also contributes to weakening of your bones? Aging of your skin?  Yellowing of your teeth and nails?  CALCIUM AND VITAMIN D:  Adequate intake of calcium and Vitamin D are recommended.  The recommendations for exact amounts of these supplements seem to change often, but generally speaking 600 mg of calcium (either carbonate or citrate) and 800 units of Vitamin D per day seems prudent. Certain women may benefit from higher intake of Vitamin D.  If you are among these women, your doctor will have told you during your visit.    PAP SMEARS:  Pap smears, to check for cervical cancer or precancers,  have traditionally been done yearly, although recent scientific advances have shown that most women can have pap smears less often.  However, every woman still should have a physical exam from her gynecologist every year. It will include a breast check, inspection of the vulva and vagina to check for abnormal growths or skin changes, a visual exam of the cervix, and then an exam to evaluate the size and shape of the uterus and  ovaries.  And after 68 years of age, a rectal exam is indicated to check for rectal cancers. We will also provide age appropriate advice regarding health maintenance, like when you should have certain vaccines, screening for sexually transmitted diseases, bone density testing, colonoscopy, mammograms, etc.   MAMMOGRAMS:  All women over 40 years old should have a yearly mammogram. Many facilities now offer a "3D" mammogram, which may cost around $50 extra out of pocket. If possible,  we recommend you accept the option to have the 3D mammogram performed.  It both reduces the number of women who will be called back for extra views which then turn out to be normal, and it is better than the routine mammogram at detecting truly abnormal areas.    COLONOSCOPY:  Colonoscopy to screen for colon cancer is recommended for all women at age 50.  We know, you hate the idea of the prep.  We agree, BUT, having colon cancer and not knowing it is worse!!  Colon cancer so often starts as a polyp that can be seen and removed at colonscopy, which can quite literally save your life!  And if your first colonoscopy is normal and you have no family history of colon cancer, most women don't have to have it again for 10 years.  Once every ten years, you can do something that may end up saving your life, right?  We will be happy to help you get it scheduled when you are ready.    Be sure to check your insurance coverage so you understand how much it will cost.  It may be covered as a preventative service at no cost, but you should check your particular policy.     Constipation Constipation is when a person has fewer than three bowel movements a week, has difficulty having a bowel movement, or has stools that are dry, hard, or larger than normal. As people grow older, constipation is more common. If you try to fix constipation with medicines that make you have a bowel movement (laxatives), the problem may get worse. Long-term laxative  use may cause the muscles of the colon to become weak. A low-fiber diet, not taking in enough fluids, and taking certain medicines may make constipation worse.  CAUSES   Certain medicines, such as antidepressants, pain medicine, iron supplements, antacids, and water pills.   Certain diseases, such as diabetes, irritable bowel syndrome (IBS), thyroid disease, or depression.   Not drinking enough water.   Not eating enough fiber-rich foods.   Stress or travel.   Lack of physical activity or exercise.   Ignoring the urge to have a bowel movement.   Using laxatives too much.  SIGNS AND SYMPTOMS   Having fewer than three bowel movements a week.   Straining to have a bowel movement.   Having stools that are hard, dry, or larger than normal.   Feeling full or bloated.   Pain in the lower abdomen.   Not feeling relief after having a bowel movement.  DIAGNOSIS  Your health care provider will take a medical history and perform a physical exam. Further testing may be done for severe constipation. Some tests may include:  A barium enema X-ray to examine your rectum, colon, and, sometimes, your small intestine.   A sigmoidoscopy to examine your lower colon.   A colonoscopy to examine your entire colon. TREATMENT  Treatment will depend on the severity of your constipation and what is causing it. Some dietary treatments include drinking more fluids and eating more fiber-rich foods. Lifestyle treatments may include regular exercise. If these diet and lifestyle recommendations do not help, your health care provider may recommend taking over-the-counter laxative medicines to help you have bowel movements. Prescription medicines may be prescribed if over-the-counter medicines do not work.  HOME CARE INSTRUCTIONS   Eat foods that have a lot of fiber, such as fruits, vegetables, whole grains, and beans.  Limit foods high in fat and processed sugars, such as french fries,  hamburgers, cookies, candies, and soda.   A fiber supplement may be added to your diet if you cannot get enough fiber from foods.   Drink enough fluids to keep your urine clear or pale yellow.   Exercise regularly or as directed by your health care provider.   Go to the restroom when you have the urge to go. Do not hold it.   Only take over-the-counter or prescription medicines as directed by your health care provider. Do not take other medicines for constipation without talking to your health care provider first.  SEEK IMMEDIATE MEDICAL CARE IF:   You have bright red blood in your stool.   Your constipation lasts for more than 4 days or gets worse.   You have abdominal or rectal pain.   You have thin, pencil-like stools.   You have unexplained weight loss. MAKE SURE YOU:   Understand these instructions.  Will watch your condition.  Will get help right away if you are not doing well or  get worse. Document Released: 08/20/2004 Document Revised: 11/27/2013 Document Reviewed: 09/03/2013 [facility] Patient Information 2015 [place], [place]. This information is not intended to replace advice given to you by your health care provider. Make sure you discuss any questions you have with your health care provider.

## 2015-06-12 NOTE — Progress Notes (Signed)
Reviewed personally.  M. Suzanne Datrell Dunton, MD.  

## 2015-06-12 NOTE — Progress Notes (Signed)
69 y.o. G27P1011 Married  Caucasian Fe here for annual exam. Menopausal on HRT. Denies vaginal bleeding or vaginal dryness. Sees Dr. Dallas Schimke PCP for aex/medication management/labs. Now on Lipitor for cholesterol. Patient would really like to stay on HRT, but aware with health changes this may not be possible. Still caring for 52 year old mother and attends care support group. Busy also with pet sitting. Some constipation but has increased water intake and roughage and is working well. No other health issues today.  Patient's last menstrual period was 08/06/2000.          Sexually active: No.  The current method of family planning is post menopausal status.    Exercising: Yes.    sit-ups, stretching & yoga Smoker:  no  Health Maintenance: Pap:  05-20-14 neg MMG: 09-11-14 category c density,birads 1:neg Colonoscopy: 2014 neg f/u 52yrs? BMD:   07-23-13 f/u 41yrs TDaP:  2009 Labs: none Self breast exam: done occ   reports that she has never smoked. She has never used smokeless tobacco. She reports that she does not drink alcohol or use illicit drugs.  Past Medical History  Diagnosis Date  . Anxiety   . Depression   . Fibromyalgia   . Dysplasia of cervix, low grade (CIN 1) 1995    LEEP  . H/O: rheumatic fever   . Condyloma   . Hepatitis B     with complete resolution- documentation - [facility]  . Broken ankle     crushed heel 2013    Past Surgical History  Procedure Laterality Date  . Bladder surgery  1980  . Hysteroscopy  2004    D&C Polyp  . Exploratory laparotomy      Due to bladder injury with fall of a horse  . Tonsillectomy      childhood    Current Outpatient Prescriptions  Medication Sig Dispense Refill  . aspirin 81 MG tablet Take 81 mg by mouth daily.    Marland Kitchen atorvastatin (LIPITOR) 40 MG tablet Take 40 mg by mouth daily.    . Calcium Carbonate-Vitamin D (CALCIUM + D PO) Take 600 mg by mouth daily.    . citalopram (CELEXA) 20 MG tablet Take 1 tablet (20 mg total) by mouth at  bedtime. 90 tablet 1  . estradiol (ESTRACE) 0.5 MG tablet Take 1 tablet (0.5 mg total) by mouth daily. 90 tablet 3  . Multiple Vitamin (MULTIVITAMIN) tablet Take 1 tablet by mouth daily.    . nortriptyline (PAMELOR) 75 MG capsule Take 1 capsule (75 mg total) by mouth at bedtime. 90 capsule 1  . progesterone (PROMETRIUM) 100 MG capsule Take 1 capsule (100 mg total) by mouth at bedtime. 30 capsule 12   No current facility-administered medications for this visit.    Family History  Problem Relation Age of Onset  . Anxiety disorder Mother   . Cancer Mother     skin  . Thyroid disease Mother   . Heart disease Mother     afib  . Depression Father   . Colon cancer Father   . Hypertension Father   . Depression Sister   . Kidney disease Maternal Grandmother   . Parkinson's disease Maternal Grandfather   . Diabetes Paternal Grandmother   . Heart disease Paternal Grandfather     ROS:  Pertinent items are noted in HPI.  Otherwise, a comprehensive ROS was negative.  Exam:   BP 126/62 mmHg  Pulse 70  Resp 16  Ht 5' 1.25" (1.556 m)  Wt  114 lb (51.71 kg)  BMI 21.36 kg/m2  LMP 08/06/2000 Height: 5' 1.25" (155.6 cm) Ht Readings from Last 3 Encounters:  06/12/15 5' 1.25" (1.556 m)  05/15/15 5' 0.5" (1.537 m)  05/08/15 5' (1.524 m)    General appearance: alert, cooperative and appears stated age Head: Normocephalic, without obvious abnormality, atraumatic Neck: no adenopathy, supple, symmetrical, trachea midline and thyroid normal to inspection and palpation Lungs: clear to auscultation bilaterally Breasts: normal appearance, no masses or tenderness, No nipple retraction or dimpling, No nipple discharge or bleeding, No axillary or supraclavicular adenopathy Heart: regular rate and rhythm Abdomen: soft, non-tender; no masses,  no organomegaly Extremities: extremities normal, atraumatic, no cyanosis or edema Skin: Skin color, texture, turgor normal. No rashes or lesions Lymph nodes:  Cervical, supraclavicular, and axillary nodes normal. No abnormal inguinal nodes palpated Neurologic: Grossly normal   Pelvic: External genitalia:  no lesions              Urethra:  normal appearing urethra with no masses, tenderness or lesions              Bartholin's and Skene's: normal                 Vagina: normal appearing vagina with normal color and discharge, no lesions              Cervix: normal,non tender,no lesions              Pap taken: Yes.   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 Discussed with change in health status need to decrease HRT and wean off. Patient agreeable and will take 1/2 tablet of 0.5 mg Estradiol and continue Prometrium 100 mg and will complete what she has. She will advise if problems. She will call and advise of status with upcoming cholesterol check.  Continue follow with MD as indicated  Pap smear taken today   counseled on breast self exam, mammography screening, adequate intake of calcium and vitamin D, diet and exercise, Kegel's exercises  return annually or prn  An After Visit Summary was printed and given to the patient.

## 2015-06-13 LAB — IPS PAP SMEAR ONLY

## 2015-08-20 ENCOUNTER — Other Ambulatory Visit: Payer: Self-pay | Admitting: Certified Nurse Midwife

## 2015-08-21 NOTE — Telephone Encounter (Signed)
Medication refill request: Prometrium Last AEX:  06/12/15 with DL Next AEX: 03/14/80 with DL Last MMG (if hormonal medication request): 09/12/14 3D dense category c, bi-rads c 1 neg Refill authorized: please advise

## 2015-08-27 ENCOUNTER — Telehealth: Payer: Self-pay | Admitting: Certified Nurse Midwife

## 2015-08-27 NOTE — Telephone Encounter (Signed)
Patient says she is having hot flashes for the first time in her life. Ms. Anna Lindsey reduced her estradiol to half a tablet and she's been experiencing hot flashes for the last 3 weeks. Patient wants to know what she should do. Best contact #: [phone]

## 2015-08-27 NOTE — Telephone Encounter (Signed)
Spoke with patient. Patient states that at her aex on 06/12/2015 Anna Lindsey CNM recommended that she start to cut her Estradiol 0.5 mg tablets in half daily. Patient states she has been doing this and has begun to have increased hot flashes and night sweats. "I am sweating all the time and I always feel hot. This never used to happen to me before." Patient is asking for additional recommendations to health with her symptoms. Also states that the tablets are not cutting in half well. Advised I will speak with Anna Lindsey CNM and return call. Patient is agreeable.

## 2015-08-27 NOTE — Telephone Encounter (Signed)
Spoke with patient. Advised of message as seen below from Deborah Leonard CNM. Patient is agreeable and verbalizes understanding.  Routing to provider for final review. Patient agreeable to disposition. Will close encounter.  

## 2015-08-27 NOTE — Telephone Encounter (Signed)
Notify patient this is normal with reduction in dosage and continue 1/2 tablet until decreases then continue to work on weaning off which may take up to 2-3 months.

## 2015-09-12 ENCOUNTER — Other Ambulatory Visit: Payer: Self-pay | Admitting: Certified Nurse Midwife

## 2015-09-12 NOTE — Telephone Encounter (Signed)
[facility] pharmacy calling to refill pt's prescription for Estradiol 0.5 mg tablets. Phone number is [phone]

## 2015-09-12 NOTE — Telephone Encounter (Signed)
Left Voicemail for pt. Will need MMG done for further refills.

## 2015-09-12 NOTE — Telephone Encounter (Signed)
Medication refill request:  Last AEX:  06/12/15 DL Next AEX: 08/19/77 DL Last MMG (if hormonal medication request): 09/12/14 BIRADS1:neg Refill authorized: 07/15/14 #90tabs/ 3R. Today #30/0R?

## 2015-09-23 DIAGNOSIS — E785 Hyperlipidemia, unspecified: Secondary | ICD-10-CM | POA: Diagnosis not present

## 2015-09-24 DIAGNOSIS — M797 Fibromyalgia: Secondary | ICD-10-CM | POA: Diagnosis not present

## 2015-09-24 DIAGNOSIS — I4519 Other right bundle-branch block: Secondary | ICD-10-CM | POA: Diagnosis not present

## 2015-09-24 DIAGNOSIS — E785 Hyperlipidemia, unspecified: Secondary | ICD-10-CM | POA: Diagnosis not present

## 2015-09-25 ENCOUNTER — Other Ambulatory Visit: Payer: Self-pay | Admitting: Certified Nurse Midwife

## 2015-09-25 NOTE — Telephone Encounter (Signed)
Medication refill request: Progesterone  Last AEX:  06-12-15  Next AEX: 06-16-16 Last MMG (if hormonal medication request): 09-12-14 WNL  Refill authorized: please advise  Patient need MM before any additional refills.

## 2015-09-29 ENCOUNTER — Other Ambulatory Visit: Payer: Self-pay | Admitting: Certified Nurse Midwife

## 2015-09-29 ENCOUNTER — Other Ambulatory Visit: Payer: Self-pay

## 2015-09-29 DIAGNOSIS — Z1231 Encounter for screening mammogram for malignant neoplasm of breast: Secondary | ICD-10-CM

## 2015-09-29 NOTE — Telephone Encounter (Signed)
Medication refill request: Progesterone  Last AEX:  06/12/15 DL Next AEX: 4/09/817/12/17 DL Last MMG (if hormonal medication request): 09/12/14 BIRADS1:neg. Has appt 11/05/15  Refill authorized: 08/21/15 #30/0R. Today #30/0R?

## 2015-10-24 ENCOUNTER — Other Ambulatory Visit: Payer: Self-pay | Admitting: Certified Nurse Midwife

## 2015-10-24 NOTE — Telephone Encounter (Signed)
Per note in chart, patient was to wean off due to her health problems. Will not refill

## 2015-10-24 NOTE — Telephone Encounter (Signed)
Medication refill request: Progesterone and Estradiol Last AEX:  06-12-2015 DL Next AEX: 16-10-960407-11-2015 DL Last MMG (if hormonal medication request): 09-12-2015 BIRADS 1  Next MMG Sched: 11-05-2015 Refill authorized: Estradiol 30 tabs 0 Refills 09-12-2015 DL  / Progesterone 30 Tabs 0 Refills 09-30-2015 DL Today #54#30 with 0 Refills ?

## 2015-10-24 NOTE — Telephone Encounter (Signed)
Patient has her mammogram schedule for 11/05/15 @ [facility] but her prescriptions will run out before that. [facility]

## 2015-10-27 ENCOUNTER — Other Ambulatory Visit: Payer: Self-pay | Admitting: Certified Nurse Midwife

## 2015-10-27 NOTE — Telephone Encounter (Signed)
Medication refill request: Estradiol Last AEX:  06/12/2015 DL Next AEX: 16/10/960407/11/2016 DL Last MMG (if hormonal medication request): 09/11/2014 BI-RADS Category 1, Negative. Next MM scheduled 11/05/2015. Refill authorized: 09/12/2015 #30 Tablets. 0 Refills Please Advise    Medication refill request: Progesterone Last AEX:  06/12/2015 DL Next AEX: 54/09/811907/11/2016 DL Last MMG (if hormonal medication request):09/11/2014 BI-RADS Category 1, Negative. Next MM scheduled 11/05/2015. Refill authorized: 09/30/2015 #30 Capsules. 0 refills Please Advise

## 2015-10-27 NOTE — Telephone Encounter (Signed)
Returned call

## 2015-10-27 NOTE — Telephone Encounter (Signed)
Called and LM for patient to callback.

## 2015-10-28 ENCOUNTER — Telehealth: Payer: Self-pay | Admitting: Certified Nurse Midwife

## 2015-10-28 NOTE — Telephone Encounter (Signed)
Called patient and informed her the instructions per Ortencia Kick. Leonard on weaning off medications.  She understands and is satisfied with the instructions.

## 2015-10-28 NOTE — Telephone Encounter (Signed)
Patient was to wean off HRT per last office notes,due to health change. Will need phone call regarding status

## 2015-10-28 NOTE — Telephone Encounter (Signed)
I will renew for the next month, she will need to wean down with 1/2 half tablet and then stop.

## 2015-10-28 NOTE — Telephone Encounter (Signed)
Patient returning your call see telephone encounter dated  10/24/2015 (encounter previously closed). Best contact # [phone]

## 2015-10-28 NOTE — Telephone Encounter (Signed)
Called patient and spoke to her about the Rx refill for Estradiol and Progesterone being denied since she is weaning off the medications.  She was concerned that she is going to be stopping "too abruptly".  She took the last Estradiol today and only has 4 more pills of the Progesterone.   Please advise.

## 2015-11-03 ENCOUNTER — Other Ambulatory Visit: Payer: Self-pay | Admitting: Obstetrics & Gynecology

## 2015-11-03 MED ORDER — PROGESTERONE MICRONIZED 100 MG PO CAPS: ORAL_CAPSULE | ORAL | Status: DC

## 2015-11-03 MED ORDER — MEDROXYPROGESTERONE ACETATE 2.5 MG PO TABS: 2.5000 mg | ORAL_TABLET | Freq: Every day | ORAL | Status: DC

## 2015-11-03 MED ORDER — ESTRADIOL 0.5 MG PO TABS: ORAL_TABLET | ORAL | Status: DC

## 2015-11-03 NOTE — Telephone Encounter (Signed)
Patient calling to check on the status of the refill request. She ran out of the prescriptions below and has not taken them for the last two days.

## 2015-11-03 NOTE — Telephone Encounter (Signed)
Rx RF for estradiol 0.5mg  daily.  Change progesterone to provera 2.5mg  daily as cannot decrease the Progesterone 100mg  capsules any further.

## 2015-11-03 NOTE — Telephone Encounter (Signed)
Rx for prometrium 100mg  days 1-15 each month given.  #45/0RF.

## 2015-11-03 NOTE — Telephone Encounter (Signed)
I spoke with patient and let her know that we called in her in medications and changed her Progesterone to Provera. She is not really wanting to take the Provera, she states "personal reasons". She is asking can she take something else instead-eh

## 2015-11-03 NOTE — Addendum Note (Signed)
Addended by: Jerene BearsMILLER, Darreld Hoffer S on: 11/03/2015 05:09 PM   Modules accepted: Orders

## 2015-11-04 NOTE — Telephone Encounter (Signed)
Patient is aware that we changed the Provera back to the Prometrium

## 2015-11-05 ENCOUNTER — Ambulatory Visit
Admission: RE | Admit: 2015-11-05 | Discharge: 2015-11-05 | Disposition: A | Discharge status: Home / Self Care | Payer: Medicare HMO | Source: Ambulatory Visit

## 2015-11-05 DIAGNOSIS — Z1231 Encounter for screening mammogram for malignant neoplasm of breast: Secondary | ICD-10-CM | POA: Diagnosis not present

## 2015-11-06 ENCOUNTER — Encounter (HOSPITAL_COMMUNITY): Payer: Self-pay | Admitting: Psychiatry

## 2015-11-06 ENCOUNTER — Ambulatory Visit (INDEPENDENT_AMBULATORY_CARE_PROVIDER_SITE_OTHER): Payer: Medicare HMO | Admitting: Psychiatry

## 2015-11-06 VITALS — BP 112/78 | HR 79 | Ht 61.75 in | Wt 115.6 lb

## 2015-11-06 DIAGNOSIS — F32A Depression, unspecified: Secondary | ICD-10-CM

## 2015-11-06 DIAGNOSIS — F329 Major depressive disorder, single episode, unspecified: Secondary | ICD-10-CM | POA: Diagnosis not present

## 2015-11-06 MED ORDER — CITALOPRAM HYDROBROMIDE 20 MG PO TABS: 20.0000 mg | ORAL_TABLET | Freq: Every day | ORAL | Status: DC

## 2015-11-06 MED ORDER — NORTRIPTYLINE HCL 75 MG PO CAPS: 75.0000 mg | ORAL_CAPSULE | Freq: Every day | ORAL | Status: DC

## 2015-11-06 NOTE — Progress Notes (Signed)
[id number]  Anna Lindsey [id number] 69 y.o.  11/06/2015 3:28 PM  Chief Complaint:  Medication management and followup.  History of Present Illness:  Anna Lindsey came in for her follow up appointment.  She is taking Pamelor and Celexa.  She sleeping better.  Lately she is spending more time with her elderly mother who is 68 years old.  She admitted some time burnout lately her daughter is also coming to help and relief her.  She has noticed further decline in her mother's health in recent months.  Though she get some time fatigue and tired but denies any crying spells, irritability, feeling of hopelessness or worthlessness.  She feel her depression is a stable on her current psychiatric medication.  She has no side effects including any shakes, tremors or any EPS.  She wants to continue her current psychiatric medication.  She regrets not going to [facility] or any support group because she does not have time.  However she is hoping once her daughter start spending some time with her mother she may able to leave the house.  Patient denies drinking or using any illegal substances.  Her appetite is okay.  Her vitals are stable.  Suicidal Ideation: No Plan Formed: No Patient has means to carry out plan: No  Homicidal Ideation: No Plan Formed: No Patient has means to carry out plan: No  Medical History; Patient has history of tonsillitis, arthritis, chronic headache, fibromyalgia and chronic fatigue.  [facility].   Past Psychiatric History/Hospitalization(s) She has depression since 191980s.  She was seeing therapist in the office since 2012. She did not recall taking any antidepressants in the past until she started seeing psychiatrists in this office.  We have tried Lamictal however patient developed a rash and did not like it. Patient denies any history of mania, psychosis, hallucinations, poor impulse control, aggression or  violence.  Patient has no history of inpatient psychiatric treatment.  Review of Systems: Psychiatric: Agitation: No Hallucination: No Depressed Mood: No Insomnia: No Hypersomnia: No Altered Concentration: No Feels Worthless: No Grandiose Ideas: No Belief In Special Powers: No New/Increased Substance Abuse: No Compulsions: No  Neurologic: Headache: No Seizure: No Paresthesias: No    Outpatient Encounter Prescriptions as of 11/06/2015  Medication Sig  . aspirin 81 MG tablet Take 81 mg by mouth daily.  Marland Kitchen. atorvastatin (LIPITOR) 40 MG tablet Take 40 mg by mouth daily.  . Calcium Carbonate-Vitamin D (CALCIUM + D PO) Take 600 mg by mouth daily.  . citalopram (CELEXA) 20 MG tablet Take 1 tablet (20 mg total) by mouth at bedtime.  Marland Kitchen. estradiol (ESTRACE) 0.5 MG tablet Take 1/2 tab daily  . medroxyPROGESTERone (PROVERA) 2.5 MG tablet Take 1 tablet (2.5 mg total) by mouth daily.  . Multiple Vitamin (MULTIVITAMIN) tablet Take 1 tablet by mouth daily.  . nortriptyline (PAMELOR) 75 MG capsule Take 1 capsule (75 mg total) by mouth at bedtime.  . progesterone (PROMETRIUM) 100 MG capsule Take 1 capsule nightly days 1-15 each month  . [DISCONTINUED] citalopram (CELEXA) 20 MG tablet Take 1 tablet (20 mg total) by mouth at bedtime.  . [DISCONTINUED] nortriptyline (PAMELOR) 75 MG capsule Take 1 capsule (75 mg total) by mouth at bedtime.   No facility-administered encounter medications on file as of 11/06/2015.    Physical Exam: Constitutional:  BP 112/78 mmHg  Pulse 79  Ht 5' 1.75" (1.568 m)  Wt 115 lb 9.6 oz (52.436 kg)  BMI 21.33 kg/m2  LMP 08/06/2000  Musculoskeletal: Strength & Muscle Tone: within normal limits Gait & Station: normal Patient leans: N/A  Mental Status Examination;  Patient is casually dressed and fairly groomed.  She is pleasant and cooperative.  Her speech is clear fluent and coherent.  Her thought process is logical and goal directed.  She described her mood  euthymic and her affect is appropriate.  She denies any auditory or visual hallucination.  Her fund of knowledge is adequate.  She denies any active or passive suicidal thoughts or homicidal thoughts.  There are no tremors or shakes.  There were no paranoia or delusion obsession present at this time.  She is alert and oriented x3.  Her insight judgment and impulse control is okay.   Established Problem, Stable/Improving (1), Review of Last Therapy Session (1) and Review of Medication Regimen & Side Effects (2)  Assessment: Axis I: Depressive disorder NOS  Axis II: Deferred  Axis III:  Past Medical History  Diagnosis Date  . Anxiety   . Depression   . Fibromyalgia   . Dysplasia of cervix, low grade (CIN 1) 1995    LEEP  . H/O: rheumatic fever   . Condyloma   . Hepatitis B     with complete resolution- documentation - [facility]  . Broken ankle     crushed heel 2013    Plan:  Patient is currently stable on her current psychotropic medication.  Discussed to further explore her supportive resources as a risk of burnout by taking care of her elderly mother.  She can knowledge and happy that she is going to ask some help from her daughter .  I offered counseling but patient declined.  I will continue Celexa 20 mg daily and Pamelor 75 mg daily .  Discussed medication side effects and benefits.  Recommend to call us back if she feels worsening of the symptoms.  Followup in 6 months.  Anna Hixon T., MD 11/06/2015

## 2015-12-03 ENCOUNTER — Other Ambulatory Visit (HOSPITAL_COMMUNITY): Payer: Self-pay | Admitting: Psychiatry

## 2015-12-26 ENCOUNTER — Encounter: Payer: Self-pay | Admitting: Family Medicine

## 2015-12-31 ENCOUNTER — Encounter: Payer: Self-pay | Admitting: Family Medicine

## 2016-01-05 ENCOUNTER — Other Ambulatory Visit (HOSPITAL_COMMUNITY): Payer: Self-pay | Admitting: Psychiatry

## 2016-01-06 ENCOUNTER — Other Ambulatory Visit (HOSPITAL_COMMUNITY): Payer: Self-pay | Admitting: Psychiatry

## 2016-01-06 NOTE — Telephone Encounter (Signed)
Given on December 1st for 90 days with additional one more refil. Too soon to refill.

## 2016-01-07 DIAGNOSIS — I4519 Other right bundle-branch block: Secondary | ICD-10-CM | POA: Diagnosis not present

## 2016-01-07 DIAGNOSIS — E785 Hyperlipidemia, unspecified: Secondary | ICD-10-CM | POA: Diagnosis not present

## 2016-01-07 DIAGNOSIS — M797 Fibromyalgia: Secondary | ICD-10-CM | POA: Diagnosis not present

## 2016-04-05 DIAGNOSIS — M797 Fibromyalgia: Secondary | ICD-10-CM | POA: Diagnosis not present

## 2016-04-05 DIAGNOSIS — I459 Conduction disorder, unspecified: Secondary | ICD-10-CM | POA: Diagnosis not present

## 2016-04-05 DIAGNOSIS — E785 Hyperlipidemia, unspecified: Secondary | ICD-10-CM | POA: Diagnosis not present

## 2016-05-06 ENCOUNTER — Encounter (HOSPITAL_COMMUNITY): Payer: Self-pay | Admitting: Psychiatry

## 2016-05-06 ENCOUNTER — Ambulatory Visit (INDEPENDENT_AMBULATORY_CARE_PROVIDER_SITE_OTHER): Payer: Medicare HMO | Admitting: Psychiatry

## 2016-05-06 VITALS — BP 130/72 | HR 80 | Ht 61.9 in | Wt 113.4 lb

## 2016-05-06 DIAGNOSIS — F329 Major depressive disorder, single episode, unspecified: Secondary | ICD-10-CM

## 2016-05-06 DIAGNOSIS — F32A Depression, unspecified: Secondary | ICD-10-CM

## 2016-05-06 MED ORDER — CITALOPRAM HYDROBROMIDE 20 MG PO TABS: 20.0000 mg | ORAL_TABLET | Freq: Every day | ORAL | Status: DC

## 2016-05-06 MED ORDER — NORTRIPTYLINE HCL 75 MG PO CAPS
75.0000 mg | ORAL_CAPSULE | Freq: Every day | ORAL | Status: DC
Start: 2016-05-06 — End: 2016-12-28

## 2016-05-06 NOTE — Progress Notes (Signed)
[facility] [id number] Progress Note  Anna Lindsey [id number] [facility]  05/06/2016 2:46 PM  Chief Complaint:  Medication management and followup.  History of Present Illness:  Anna Lindsey came in for her follow up appointment.  She is taking her medication as prescribed.  She is taking Celexa and nortriptyline and reported no side effects.  Now her mother moved in with her and she is happy because she does not have to travel to her house every day.  Recently she has requested home health aid for her mother which was approved and she is very pleased.  Patient denies any irritability, anger, mood swing.  She is concerned about her daughter who has substance abuse problem.  She is trying to support her daughter and her mother.  She admitted getting some time overwhelmed but does not feel any feeling of hopelessness or worthlessness.  She has no agitation, anger, mood swing.  Her sleep is good.  Her energy level is okay.  She scheduled to see her primary care physician in October for her blood work. Patient denies drinking or using any illegal substances.  Her appetite is okay.  Her vitals are stable.  Suicidal Ideation: No Plan Formed: No Patient has means to carry out plan: No  Homicidal Ideation: No Plan Formed: No Patient has means to carry out plan: No  Medical History; Patient has history of tonsillitis, arthritis, chronic headache, fibromyalgia and chronic fatigue.  Her primary care physician is [facility] Ronne Lindsey [facility].   Past Psychiatric History/Hospitalization(s) She has depression since 501980s.  She was seeing therapist in the office since 2012. She did not recall taking any antidepressants in the past until she started seeing psychiatrists in this office.  We have tried Lamictal however patient developed a rash and did not like it. Patient denies any history of mania, psychosis, hallucinations, poor impulse control, aggression or violence.  Patient has no history of inpatient  psychiatric treatment.  Review of Systems: Psychiatric: Agitation: No Hallucination: No Depressed Mood: No Insomnia: No Hypersomnia: No Altered Concentration: No Feels Worthless: No Grandiose Ideas: No Belief In Special Powers: No New/Increased Substance Abuse: No Compulsions: No  Neurologic: Headache: No Seizure: No Paresthesias: No    Outpatient Encounter Prescriptions as of 05/06/2016  Medication Sig  . aspirin 81 MG tablet Take 81 mg by mouth daily.  Marland Kitchen. atorvastatin (LIPITOR) 40 MG tablet Take 40 mg by mouth daily.  . Calcium Carbonate-Vitamin D (CALCIUM + D PO) Take 600 mg by mouth daily.  . citalopram (CELEXA) 20 MG tablet Take 1 tablet (20 mg total) by mouth at bedtime.  Marland Kitchen. estradiol (ESTRACE) 0.5 MG tablet Take 1/2 tab daily  . medroxyPROGESTERone (PROVERA) 2.5 MG tablet Take 1 tablet (2.5 mg total) by mouth daily.  . Multiple Vitamin (MULTIVITAMIN) tablet Take 1 tablet by mouth daily.  . nortriptyline (PAMELOR) 75 MG capsule Take 1 capsule (75 mg total) by mouth at bedtime.  . progesterone (PROMETRIUM) 100 MG capsule Take 1 capsule nightly days 1-15 each month  . [DISCONTINUED] citalopram (CELEXA) 20 MG tablet Take 1 tablet (20 mg total) by mouth at bedtime.  . [DISCONTINUED] nortriptyline (PAMELOR) 75 MG capsule Take 1 capsule (75 mg total) by mouth at bedtime.   No facility-administered encounter medications on file as of 05/06/2016.    Physical Exam: Constitutional:  BP 130/72 mmHg  Pulse 80  Ht 5' 1.9" (1.572 m)  Wt 113 lb 6.4 oz (51.438 kg)  BMI 20.82 kg/m2  LMP  08/06/2000  Musculoskeletal: Strength & Muscle Tone: within normal limits Gait & Station: normal Patient leans: N/A  Mental Status Examination;  Patient is casually dressed and fairly groomed.  She is pleasant and cooperative.  Her speech is clear fluent and coherent.  Her thought process is logical and goal directed.  She described her mood euthymic and her affect is appropriate.  She denies any  auditory or visual hallucination.  Her fund of knowledge is adequate.  She denies any active or passive suicidal thoughts or homicidal thoughts.  There are no tremors or shakes.  There were no paranoia or delusion obsession present at this time.  She is alert and oriented x3.  Her insight judgment and impulse control is okay.   Established Problem, Stable/Improving (1), Review of Last Therapy Session (1) and Review of Medication Regimen & Side Effects (2)  Assessment: Axis I: Depressive disorder NOS  Axis II: Deferred  Axis III:  Past Medical History  Diagnosis Date  . Anxiety   . Depression   . Fibromyalgia   . Dysplasia of cervix, low grade (CIN 1) 1995    LEEP  . H/O: rheumatic fever   . Condyloma   . Hepatitis B     with complete resolution- documentation - [facility]  . Broken ankle     crushed heel 2013    Plan:  Patient is stable on her current psychotropic medication.  She wants to continue Celexa and nortriptyline.  She has no side effects. I offered counseling but patient declined.  I will continue Celexa 20 mg daily and Pamelor 75 mg daily .  Discussed medication side effects and benefits.  Recommend to call us back if she feels worsening of the symptoms.  Followup in 6 months.  Orey Moure T., MD 05/06/2016

## 2016-06-16 ENCOUNTER — Encounter: Payer: Self-pay | Admitting: Certified Nurse Midwife

## 2016-06-16 ENCOUNTER — Ambulatory Visit (INDEPENDENT_AMBULATORY_CARE_PROVIDER_SITE_OTHER): Payer: Medicare HMO | Admitting: Certified Nurse Midwife

## 2016-06-16 VITALS — BP 118/80 | HR 72 | Resp 16 | Ht 60.25 in | Wt 110.0 lb

## 2016-06-16 DIAGNOSIS — Z01419 Encounter for gynecological examination (general) (routine) without abnormal findings: Secondary | ICD-10-CM | POA: Diagnosis not present

## 2016-06-16 DIAGNOSIS — Z0000 Encounter for general adult medical examination without abnormal findings: Secondary | ICD-10-CM

## 2016-06-16 DIAGNOSIS — Z124 Encounter for screening for malignant neoplasm of cervix: Secondary | ICD-10-CM

## 2016-06-16 NOTE — Progress Notes (Signed)
70 y.o. G19P1011 Married  Caucasian Fe here for annual exam. Menopausal no HRT. Denies vaginal bleeding or vaginal dryness. Sees Cardiology for cholesterol management and labs. Sees Psychiatrist for medication management of Celexa for anxiety. Using coconut oil for vaginal dryness working well. PCP is moving and will be establishing with a new PCP. No other health issues today.  Patient's last menstrual period was 08/06/2000.          Sexually active: No.  The current method of family planning is post menopausal status.    Exercising: Yes.    walking, silver sneakers Smoker:  no  Health Maintenance: Pap:  06-12-15 neg hx of LEEP MMG:  11-05-15 category b density birads 1:neg Colonoscopy: 2014 neg f/u 8yrs? BMD:   2014  Needs repeat will schedule TDaP:  2009 Shingles: no Pneumonia: 2015 Hep C and HIV: HIV neg yrs ago Labs: none Self breast exam: done occ   reports that she has never smoked. She has never used smokeless tobacco. She reports that she does not drink alcohol or use illicit drugs.  Past Medical History  Diagnosis Date  . Anxiety   . Depression   . Fibromyalgia   . Dysplasia of cervix, low grade (CIN 1) 1995    LEEP  . H/O: rheumatic fever   . Condyloma   . Hepatitis B     with complete resolution- documentation - [facility]  . Broken ankle     crushed heel 2013    Past Surgical History  Procedure Laterality Date  . Bladder surgery  1980  . Hysteroscopy  2004    D&C Polyp  . Exploratory laparotomy      Due to bladder injury with fall of a horse  . Tonsillectomy      childhood    Current Outpatient Prescriptions  Medication Sig Dispense Refill  . aspirin 81 MG tablet Take 81 mg by mouth daily.    Marland Kitchen atorvastatin (LIPITOR) 40 MG tablet Take 40 mg by mouth daily.    . Calcium Carbonate-Vitamin D (CALCIUM + D PO) Take 600 mg by mouth daily.    . citalopram (CELEXA) 20 MG tablet Take 1 tablet (20 mg total) by mouth at bedtime. 90 tablet 1  . Multiple Vitamin  (MULTIVITAMIN) tablet Take 1 tablet by mouth daily.    . nortriptyline (PAMELOR) 75 MG capsule Take 1 capsule (75 mg total) by mouth at bedtime. 90 capsule 1   No current facility-administered medications for this visit.    Family History  Problem Relation Age of Onset  . Anxiety disorder Mother   . Cancer Mother     skin  . Thyroid disease Mother   . Heart disease Mother     afib  . Depression Father   . Colon cancer Father   . Hypertension Father   . Depression Sister   . Kidney disease Maternal Grandmother   . Parkinson's disease Maternal Grandfather   . Diabetes Paternal Grandmother   . Heart disease Paternal Grandfather     ROS:  Pertinent items are noted in HPI.  Otherwise, a comprehensive ROS was negative.  Exam:   BP 118/80 mmHg  Pulse 72  Resp 16  Ht 5' 0.25" (1.53 m)  Wt 110 lb (49.896 kg)  BMI 21.31 kg/m2  LMP 08/06/2000 Height: 5' 0.25" (153 cm) Ht Readings from Last 3 Encounters:  06/16/16 5' 0.25" (1.53 m)  05/06/16 5' 1.9" (1.572 m)  11/06/15 5' 1.75" (1.568 m)    General appearance:  alert, cooperative and appears stated age Head: Normocephalic, without obvious abnormality, atraumatic Neck: no adenopathy, supple, symmetrical, trachea midline and thyroid normal to inspection and palpation Lungs: clear to auscultation bilaterally Breasts: normal appearance, no masses or tenderness, No nipple retraction or dimpling, No nipple discharge or bleeding, No axillary or supraclavicular adenopathy Heart: regular rate and rhythm Abdomen: soft, non-tender; no masses,  no organomegaly Extremities: extremities normal, atraumatic, no cyanosis or edema Skin: Skin color, texture, turgor normal. No rashes or lesions Lymph nodes: Cervical, supraclavicular, and axillary nodes normal. No abnormal inguinal nodes palpated Neurologic: Grossly normal   Pelvic: External genitalia:  no lesions              Urethra:  normal appearing urethra with no masses, tenderness or  lesions              Bartholin's and Skene's: normal                 Vagina: normal appearing vagina with normal color and discharge, no lesions              Cervix: multiparous appearance, no cervical motion tenderness and no lesions              Pap taken: Yes.   Bimanual Exam:  Uterus:  normal size, contour, position, consistency, mobility, non-tender              Adnexa: normal adnexa and no mass, fullness, tenderness               Rectovaginal: Confirms               Anus:  normal sphincter tone, no lesions  Chaperone present: yes  A:  Well Woman with normal exam  Menopausal no HRT.  Decrease libido  Osteopenia BMD due  Screening labs  P:   Reviewed health and wellness pertinent to exam  Aware of need to evaluate if vaginal bleeding  Discussed not abnormal in menopausal years and change in vaginal tissues and relationship focus. Discussed planning date night to encourage more intimate relationship and discuss with spouse. Patient appreciative of advise.  Patient aware BMD due will schedule.  Lab: TSH,Vitamin D, Hep.C  Pap smear as above with HPVHR    counseled on breast self exam, mammography screening, adequate intake of calcium and vitamin D, diet and exercise  return annually or prn  An After Visit Summary was printed and given to the patient.

## 2016-06-16 NOTE — Patient Instructions (Signed)

## 2016-06-17 LAB — TSH: TSH: 0.93 mIU/L

## 2016-06-17 LAB — VITAMIN D 25 HYDROXY (VIT D DEFICIENCY, FRACTURES): Vit D, 25-Hydroxy: 45 ng/mL (ref 30–100)

## 2016-06-17 LAB — HEPATITIS C ANTIBODY: HCV AB: NEGATIVE

## 2016-06-18 LAB — IPS PAP SMEAR ONLY

## 2016-06-21 NOTE — Progress Notes (Signed)
Encounter reviewed Jill Jertson, MD   

## 2016-07-08 ENCOUNTER — Other Ambulatory Visit (HOSPITAL_COMMUNITY): Payer: Self-pay | Admitting: Psychiatry

## 2016-07-08 DIAGNOSIS — F329 Major depressive disorder, single episode, unspecified: Secondary | ICD-10-CM

## 2016-07-08 DIAGNOSIS — F32A Depression, unspecified: Secondary | ICD-10-CM

## 2016-07-21 ENCOUNTER — Telehealth: Payer: Self-pay

## 2016-07-21 ENCOUNTER — Ambulatory Visit (INDEPENDENT_AMBULATORY_CARE_PROVIDER_SITE_OTHER): Payer: Medicare HMO | Admitting: Urgent Care

## 2016-07-21 VITALS — BP 138/80 | HR 88 | Temp 98.2°F | Resp 16 | Ht 60.25 in | Wt 110.0 lb

## 2016-07-21 DIAGNOSIS — K529 Noninfective gastroenteritis and colitis, unspecified: Secondary | ICD-10-CM | POA: Diagnosis not present

## 2016-07-21 DIAGNOSIS — R630 Anorexia: Secondary | ICD-10-CM | POA: Diagnosis not present

## 2016-07-21 DIAGNOSIS — R112 Nausea with vomiting, unspecified: Secondary | ICD-10-CM | POA: Diagnosis not present

## 2016-07-21 LAB — POCT CBC
GRANULOCYTE PERCENT: 62.5 % (ref 37–80)
HCT, POC: 40.7 % (ref 37.7–47.9)
Hemoglobin: 14.6 g/dL (ref 12.2–16.2)
LYMPH, POC: 2 (ref 0.6–3.4)
MCH, POC: 32.3 pg — AB (ref 27–31.2)
MCHC: 36 g/dL — AB (ref 31.8–35.4)
MCV: 89.6 fL (ref 80–97)
MID (cbc): 0.4 (ref 0–0.9)
MPV: 6.8 fL (ref 0–99.8)
PLATELET COUNT, POC: 316 10*3/uL (ref 142–424)
POC Granulocyte: 3.9 (ref 2–6.9)
POC LYMPH %: 31.7 % (ref 10–50)
POC MID %: 5.8 %M (ref 0–12)
RBC: 4.54 M/uL (ref 4.04–5.48)
RDW, POC: 12.4 %
WBC: 6.2 10*3/uL (ref 4.6–10.2)

## 2016-07-21 LAB — POCT URINALYSIS DIP (MANUAL ENTRY)
Bilirubin, UA: NEGATIVE
Glucose, UA: NEGATIVE
Ketones, POC UA: NEGATIVE
LEUKOCYTES UA: NEGATIVE
NITRITE UA: NEGATIVE
PROTEIN UA: NEGATIVE
Spec Grav, UA: 1.015
UROBILINOGEN UA: 0.2
pH, UA: 8.5

## 2016-07-21 LAB — POC MICROSCOPIC URINALYSIS (UMFC): MUCUS RE: ABSENT

## 2016-07-21 MED ORDER — ONDANSETRON 4 MG PO TBDP
8.0000 mg | ORAL_TABLET | Freq: Once | ORAL | Status: AC
  Administered 2016-07-21: 8 mg via ORAL

## 2016-07-21 MED ORDER — ONDANSETRON 8 MG PO TBDP: 8.0000 mg | ORAL_TABLET | Freq: Three times a day (TID) | ORAL | 0 refills | Status: DC | PRN

## 2016-07-21 NOTE — Telephone Encounter (Signed)
I discussed the potential for adverse effects with the patient. She is willing to try Zofran. Will return to clinic if 1-2 days if symptoms worsen or persist.

## 2016-07-21 NOTE — Telephone Encounter (Signed)
Pharm called and reported an interaction between zofran that was Rxd and citalopram that pt is taking. Wanted Anna Lindsey to be aware that could cause QT prolongation and if use of both concurrently is absolutely necessary, ECG monitoring is suggested. Pharm asked if I could make sure that we get her an answer ASAP today so that they will know what to give pt when she gets there.

## 2016-07-21 NOTE — Patient Instructions (Addendum)
Viral Gastroenteritis Viral gastroenteritis is also known as stomach flu. This condition affects the stomach and intestinal tract. It can cause sudden diarrhea and vomiting. The illness typically lasts 3 to 8 days. Most people develop an immune response that eventually gets rid of the virus. While this natural response develops, the virus can make you quite ill. CAUSES  Many different viruses can cause gastroenteritis, such as rotavirus or noroviruses. You can catch one of these viruses by consuming contaminated food or water. You may also catch a virus by sharing utensils or other personal items with an infected person or by touching a contaminated surface. SYMPTOMS  The most common symptoms are diarrhea and vomiting. These problems can cause a severe loss of body fluids (dehydration) and a body salt (electrolyte) imbalance. Other symptoms may include:  Fever.  Headache.  Fatigue.  Abdominal pain. DIAGNOSIS  Your caregiver can usually diagnose viral gastroenteritis based on your symptoms and a physical exam. A stool sample may also be taken to test for the presence of viruses or other infections. TREATMENT  This illness typically goes away on its own. Treatments are aimed at rehydration. The most serious cases of viral gastroenteritis involve vomiting so severely that you are not able to keep fluids down. In these cases, fluids must be given through an intravenous line (IV). HOME CARE INSTRUCTIONS   Drink enough fluids to keep your urine clear or pale yellow. Drink small amounts of fluids frequently and increase the amounts as tolerated.  Ask your caregiver for specific rehydration instructions.  Avoid:  Foods high in sugar.  Alcohol.  Carbonated drinks.  Tobacco.  Juice.  Caffeine drinks.  Extremely hot or cold fluids.  Fatty, greasy foods.  Too much intake of anything at one time.  Dairy products until 24 to 48 hours after diarrhea stops.  You may consume probiotics.  Probiotics are active cultures of beneficial bacteria. They may lessen the amount and number of diarrheal stools in adults. Probiotics can be found in yogurt with active cultures and in supplements.  Wash your hands well to avoid spreading the virus.  Only take over-the-counter or prescription medicines for pain, discomfort, or fever as directed by your caregiver. Do not give aspirin to children. Antidiarrheal medicines are not recommended.  Ask your caregiver if you should continue to take your regular prescribed and over-the-counter medicines.  Keep all follow-up appointments as directed by your caregiver. SEEK IMMEDIATE MEDICAL CARE IF:   You are unable to keep fluids down.  You do not urinate at least once every 6 to 8 hours.  You develop shortness of breath.  You notice blood in your stool or vomit. This may look like coffee grounds.  You have abdominal pain that increases or is concentrated in one small area (localized).  You have persistent vomiting or diarrhea.  You have a fever.  The patient is a child younger than 3 months, and he or she has a fever.  The patient is a child older than 3 months, and he or she has a fever and persistent symptoms.  The patient is a child older than 3 months, and he or she has a fever and symptoms suddenly get worse.  The patient is a baby, and he or she has no tears when crying. MAKE SURE YOU:   Understand these instructions.  Will watch your condition.  Will get help right away if you are not doing well or get worse.   This information is not intended to replace   advice given to you by your health care provider. Make sure you discuss any questions you have with your health care provider.   Document Released: 11/22/2005 Document Revised: 02/14/2012 Document Reviewed: 09/08/2011 Elsevier Interactive Patient Education 2016 Elsevier Inc[employer]     IF you received an x-ray today, you will receive an invoice from [facility].  Please contact [facility] at [phone] with questions or concerns regarding your invoice.   IF you received labwork today, you will receive an invoice from [facility]. Please contact [facility] at [phone] with questions or concerns regarding your invoice.   Our billing staff will not be able to assist you with questions regarding bills from these companies.  You will be contacted with the lab results as soon as they are available. The fastest way to get your results is to activate your My Chart account. Instructions are located on the last page of this paperwork. If you have not heard from us regarding the results in 2 weeks, please contact this office.      

## 2016-07-21 NOTE — Progress Notes (Signed)
MRN: [id number] DOB: [birth year]  Subjective:   Anna Lindsey is a 70 y.o. female presenting for chief complaint of Nausea (with vomiting x 3 days, not able to eat much )  Reports 3 day history of nausea with vomiting, vomited 4x yesterday, decreased appetite. Has been trying to hydrate. Has been eating dried toast, Egg McMuffin. Denies fever, abdominal pain, diarrhea, constipation, bloody vomit or stools, difficulty swallowing, confusion, dysuria, urinary frequency. Of note, patient takes care of her mother who is in hospice care at home, handles stools but tries to have very good hand hygiene.  Lucely has a current medication list which includes the following prescription(s): aspirin, atorvastatin, calcium citrate-vitamin d, citalopram, multivitamin, and nortriptyline. Also is allergic to ciprofloxacin.  Marishka  has a past medical history of Anxiety; Broken ankle; Condyloma; Depression; Dysplasia of cervix, low grade (CIN 1) (1995); Fibromyalgia; H/O: rheumatic fever; and Hepatitis B. Also  has a past surgical history that includes Bladder surgery (1980); Hysteroscopy (2004); Exploratory laparotomy; and Tonsillectomy.  Objective:   Vitals: BP 138/80 (BP Location: Left Arm, Patient Position: Sitting, Cuff Size: Small)   Pulse 88   Temp 98.2 F (36.8 C) (Oral)   Resp 16   Ht 5' 0.25" (1.53 m)   Wt 110 lb (49.9 kg)   LMP 08/06/2000   SpO2 99%   BMI 21.30 kg/m   Physical Exam  Constitutional: She is oriented to person, place, and time. She appears well-developed and well-nourished.  HENT:  Mouth/Throat: Oropharynx is clear and moist.  Eyes: No scleral icterus.  Cardiovascular: Normal rate, regular rhythm and intact distal pulses.  Exam reveals no gallop and no friction rub.   No murmur heard. Pulmonary/Chest: No respiratory distress. She has no wheezes. She has no rales.  Abdominal: Soft. Bowel sounds are normal. She exhibits no distension and no mass. There is no tenderness.    Neurological: She is alert and oriented to person, place, and time.  Skin: Skin is warm and dry.   Results for orders placed or performed in visit on 07/21/16 (from the past 24 hour(s))  POCT urinalysis dipstick     Status: Abnormal   Collection Time: 07/21/16  2:57 PM  Result Value Ref Range   Color, UA yellow yellow   Clarity, UA clear clear   Glucose, UA negative negative   Bilirubin, UA negative negative   Ketones, POC UA negative negative   Spec Grav, UA 1.015    Blood, UA trace-intact (A) negative   pH, UA 8.5    Protein Ur, POC negative negative   Urobilinogen, UA 0.2    Nitrite, UA Negative Negative   Leukocytes, UA Negative Negative  POCT Microscopic Urinalysis ([facility])     Status: None   Collection Time: 07/21/16  2:58 PM  Result Value Ref Range   WBC,UR,HPF,POC None None WBC/hpf   RBC,UR,HPF,POC None None RBC/hpf   Bacteria None None, Too numerous to count   Mucus Absent Absent   Epithelial Cells, UR Per Microscopy None None, Too numerous to count cells/hpf  POCT CBC     Status: Abnormal   Collection Time: 07/21/16  2:58 PM  Result Value Ref Range   WBC 6.2 4.6 - 10.2 K/uL   Lymph, poc 2.0 0.6 - 3.4   POC LYMPH PERCENT 31.7 10 - 50 %L   MID (cbc) 0.4 0 - 0.9   POC MID % 5.8 0 - 12 %M   POC Granulocyte 3.9 2 - 6.9   Granulocyte  percent 62.5 37 - 80 %G   RBC 4.54 4.04 - 5.48 M/uL   Hemoglobin 14.6 12.2 - 16.2 g/dL   HCT, POC 86.540.7 78.437.7 - 47.9 %   MCV 89.6 80 - 97 fL   MCH, POC 32.3 (A) 27 - 31.2 pg   MCHC 36.0 (A) 31.8 - 35.4 g/dL   RDW, POC 69.612.4 %   Platelet Count, POC 316 142 - 424 K/uL   MPV 6.8 0 - 99.8 fL   Assessment and Plan :   1. Gastroenteritis 2. Nausea and vomiting, intractability of vomiting not specified, unspecified vomiting type 3. Decreased appetite - Suspect viral gastroenteritis. Physical exam findings and labs reassuring. Will start supportive care, rtc if symptoms worsen or do not improve in 5 days.  Wallis BambergMario Lequita Meadowcroft, PA-C [facility] [phone] 07/21/2016 2:33 PM

## 2016-07-22 NOTE — Telephone Encounter (Signed)
Called pharm back and LM that they may fill the zofran Rx if they have not already.

## 2016-08-06 DIAGNOSIS — Z0000 Encounter for general adult medical examination without abnormal findings: Secondary | ICD-10-CM | POA: Diagnosis not present

## 2016-08-06 DIAGNOSIS — R69 Illness, unspecified: Secondary | ICD-10-CM | POA: Diagnosis not present

## 2016-08-06 DIAGNOSIS — E7800 Pure hypercholesterolemia, unspecified: Secondary | ICD-10-CM | POA: Diagnosis not present

## 2016-08-11 DIAGNOSIS — M791 Myalgia: Secondary | ICD-10-CM | POA: Diagnosis not present

## 2016-08-11 DIAGNOSIS — E785 Hyperlipidemia, unspecified: Secondary | ICD-10-CM | POA: Diagnosis not present

## 2016-08-11 DIAGNOSIS — I4519 Other right bundle-branch block: Secondary | ICD-10-CM | POA: Diagnosis not present

## 2016-10-14 DIAGNOSIS — E785 Hyperlipidemia, unspecified: Secondary | ICD-10-CM | POA: Diagnosis not present

## 2016-11-08 ENCOUNTER — Ambulatory Visit (HOSPITAL_COMMUNITY): Payer: Self-pay | Admitting: Psychiatry

## 2016-11-17 ENCOUNTER — Ambulatory Visit (HOSPITAL_COMMUNITY): Payer: Self-pay | Admitting: Psychiatry

## 2016-12-15 DIAGNOSIS — I00 Rheumatic fever without heart involvement: Secondary | ICD-10-CM | POA: Diagnosis not present

## 2016-12-15 DIAGNOSIS — R0789 Other chest pain: Secondary | ICD-10-CM | POA: Diagnosis not present

## 2016-12-15 DIAGNOSIS — M797 Fibromyalgia: Secondary | ICD-10-CM | POA: Diagnosis not present

## 2016-12-15 DIAGNOSIS — E785 Hyperlipidemia, unspecified: Secondary | ICD-10-CM | POA: Diagnosis not present

## 2016-12-15 DIAGNOSIS — R69 Illness, unspecified: Secondary | ICD-10-CM | POA: Diagnosis not present

## 2016-12-21 DIAGNOSIS — Z0000 Encounter for general adult medical examination without abnormal findings: Secondary | ICD-10-CM | POA: Diagnosis not present

## 2016-12-28 ENCOUNTER — Ambulatory Visit (INDEPENDENT_AMBULATORY_CARE_PROVIDER_SITE_OTHER): Payer: Medicare HMO | Admitting: Psychiatry

## 2016-12-28 ENCOUNTER — Encounter (HOSPITAL_COMMUNITY): Payer: Self-pay | Admitting: Psychiatry

## 2016-12-28 VITALS — BP 132/80 | HR 89 | Ht 63.75 in | Wt 113.8 lb

## 2016-12-28 DIAGNOSIS — Z818 Family history of other mental and behavioral disorders: Secondary | ICD-10-CM | POA: Diagnosis not present

## 2016-12-28 DIAGNOSIS — Z841 Family history of disorders of kidney and ureter: Secondary | ICD-10-CM

## 2016-12-28 DIAGNOSIS — Z8349 Family history of other endocrine, nutritional and metabolic diseases: Secondary | ICD-10-CM | POA: Diagnosis not present

## 2016-12-28 DIAGNOSIS — R69 Illness, unspecified: Secondary | ICD-10-CM | POA: Diagnosis not present

## 2016-12-28 DIAGNOSIS — F330 Major depressive disorder, recurrent, mild: Secondary | ICD-10-CM | POA: Diagnosis not present

## 2016-12-28 DIAGNOSIS — Z808 Family history of malignant neoplasm of other organs or systems: Secondary | ICD-10-CM

## 2016-12-28 DIAGNOSIS — Z79899 Other long term (current) drug therapy: Secondary | ICD-10-CM

## 2016-12-28 DIAGNOSIS — Z888 Allergy status to other drugs, medicaments and biological substances status: Secondary | ICD-10-CM

## 2016-12-28 DIAGNOSIS — Z8249 Family history of ischemic heart disease and other diseases of the circulatory system: Secondary | ICD-10-CM

## 2016-12-28 DIAGNOSIS — Z8489 Family history of other specified conditions: Secondary | ICD-10-CM

## 2016-12-28 DIAGNOSIS — Z7982 Long term (current) use of aspirin: Secondary | ICD-10-CM

## 2016-12-28 MED ORDER — NORTRIPTYLINE HCL 75 MG PO CAPS: 75.0000 mg | ORAL_CAPSULE | Freq: Every day | ORAL | 1 refills | Status: DC

## 2016-12-28 MED ORDER — CITALOPRAM HYDROBROMIDE 20 MG PO TABS: ORAL_TABLET | ORAL | 1 refills | Status: DC

## 2016-12-28 NOTE — Progress Notes (Signed)
BH MD/PA/NP OP Progress Note  12/28/2016 11:37 AM Anna Lindsey  MRN:  [id number]  Chief Complaint:  Subjective:  I'm doing so-so.  I think old brother is making me depressed.  Some days I have no energy.  HPI: Patient came for her follow-up appointment.  She is taking her medication as prescribed.  She endorse some days she has no energy but she believed due to cold weather.  She continued to struggle dealing with her 70 year old mother.  Sometimes she feels her husband is not supportive.  She sleeping good.  She denies any irritability, anger, mania or any psychosis.  She denies any crying spells or any feeling of hopelessness or worthlessness.  She is reluctant to change or increase the dosage of the medication.  Her energy level is fair.  Patient denies drinking alcohol or using any illegal substances.  She lives with her husband and 70 year old mother.  Visit Diagnosis:    ICD-9-CM ICD-10-CM   1. Mild episode of recurrent major depressive disorder (HCC) 296.31 F33.0 citalopram (CELEXA) 20 MG tablet     nortriptyline (PAMELOR) 75 MG capsule    Past Psychiatric History: Reviewed.  Past Medical History:  Past Medical History:  Diagnosis Date  . Anxiety   . Broken ankle    crushed heel 2013  . Condyloma   . Depression   . Dysplasia of cervix, low grade (CIN 1) 1995   LEEP  . Fibromyalgia   . H/O: rheumatic fever   . Hepatitis B    with complete resolution- documentation - [facility]    Past Surgical History:  Procedure Laterality Date  . BLADDER SURGERY  1980  . EXPLORATORY LAPAROTOMY     Due to bladder injury with fall of a horse  . HYSTEROSCOPY  2004   D&C Polyp  . TONSILLECTOMY     childhood    Family Psychiatric History: Reviewed.  Family History:  Family History  Problem Relation Age of Onset  . Anxiety disorder Mother   . Cancer Mother     skin  . Thyroid disease Mother   . Heart disease Mother     afib  . Depression Father   . Colon cancer Father   .  Hypertension Father   . Depression Sister   . Kidney disease Maternal Grandmother   . Parkinson's disease Maternal Grandfather   . Diabetes Paternal Grandmother   . Heart disease Paternal Grandfather     Social History:  Social History   Social History  . Marital status: Married    Spouse name: N/A  . Number of children: N/A  . Years of education: N/A   Social History Main Topics  . Smoking status: Never Smoker  . Smokeless tobacco: Never Used  . Alcohol use No  . Drug use: No  . Sexual activity: Not Currently    Partners: Male    Birth control/ protection: Post-menopausal   Other Topics Concern  . None   Social History Narrative   Exercise stretching once a day for 5 minutes    Allergies:  Allergies  Allergen Reactions  . Ciprofloxacin Anxiety    Metabolic Disorder Labs: No results found for: HGBA1C, MPG No results found for: PROLACTIN Lab Results  Component Value Date   CHOL 216 (H) 04/02/2015   TRIG 60 04/02/2015   HDL 58 04/02/2015   CHOLHDL 3.7 04/02/2015   VLDL 12 04/02/2015   LDLCALC 146 (H) 04/02/2015   LDLCALC 131 (H) 10/28/2014     Current  Medications: Current Outpatient Prescriptions  Medication Sig Dispense Refill  . aspirin 81 MG tablet Take 81 mg by mouth daily.    Marland Kitchen atorvastatin (LIPITOR) 40 MG tablet Take 40 mg by mouth daily.    . Calcium Carbonate-Vitamin D (CALCIUM + D PO) Take 600 mg by mouth daily.    . citalopram (CELEXA) 20 MG tablet TAKE 1 TABLET BY MOUTH ATBEDTIME. 90 tablet 1  . Multiple Vitamin (MULTIVITAMIN) tablet Take 1 tablet by mouth daily.    . nortriptyline (PAMELOR) 75 MG capsule Take 1 capsule (75 mg total) by mouth at bedtime. 90 capsule 1  . ondansetron (ZOFRAN-ODT) 8 MG disintegrating tablet Take 1 tablet (8 mg total) by mouth every 8 (eight) hours as needed for nausea. 15 tablet 0   No current facility-administered medications for this visit.     Neurologic: Headache: No Seizure: No Paresthesias:  No  Musculoskeletal: Strength & Muscle Tone: within normal limits Gait & Station: normal Patient leans: N/A  Psychiatric Specialty Exam: Review of Systems  Constitutional: Negative.   HENT: Negative.   Musculoskeletal: Negative.   Skin: Negative.   Neurological: Negative.     Blood pressure 132/80, pulse 89, height 5' 3.75" (1.619 m), weight 113 lb 12.8 oz (51.6 kg), last menstrual period 08/06/2000.Body mass index is 19.69 kg/m.  General Appearance: Casual  Eye Contact:  Good  Speech:  Clear and Coherent  Volume:  Normal  Mood:  Anxious  Affect:  Congruent  Thought Process:  Goal Directed  Orientation:  Full (Time, Place, and Person)  Thought Content: WDL and Logical   Suicidal Thoughts:  No  Homicidal Thoughts:  No  Memory:  Immediate;   Good Recent;   Good Remote;   Good  Judgement:  Good  Insight:  Good  Psychomotor Activity:  Normal  Concentration:  Concentration: Good and Attention Span: Good  Recall:  Good  Fund of Knowledge: Good  Language: Good  Akathisia:  No  Handed:  Right  AIMS (if indicated):  0  Assets:  Communication Skills Desire for Improvement Housing Resilience  ADL's:  Intact  Cognition: WNL  Sleep:  good   Assessment: We are depressive disorder, recurrent mild.  Plan: Patient continues to struggle with on and off depressive symptoms but reluctant to increase the dosage of medication.  She mentioned she will weight if symptoms continue to persist after court rather than she will call us to adjust the medication.  I will continue nortriptyline 75 mg daily and Celexa 20 mg daily.  She has no side effects including any tremors, shakes or any EPS.  Recommended to call us back if she has any question, concern if she feels worsening of the symptom.  Follow-up in 6 months.       Mikael Skoda T., MD 12/28/2016, 11:37 AM

## 2016-12-29 ENCOUNTER — Other Ambulatory Visit: Payer: Self-pay | Admitting: Certified Nurse Midwife

## 2016-12-29 DIAGNOSIS — Z1231 Encounter for screening mammogram for malignant neoplasm of breast: Secondary | ICD-10-CM

## 2016-12-30 DIAGNOSIS — R69 Illness, unspecified: Secondary | ICD-10-CM | POA: Diagnosis not present

## 2016-12-30 DIAGNOSIS — Z7982 Long term (current) use of aspirin: Secondary | ICD-10-CM | POA: Diagnosis not present

## 2016-12-30 DIAGNOSIS — M797 Fibromyalgia: Secondary | ICD-10-CM | POA: Diagnosis not present

## 2016-12-30 DIAGNOSIS — G4700 Insomnia, unspecified: Secondary | ICD-10-CM | POA: Diagnosis not present

## 2016-12-30 DIAGNOSIS — M19042 Primary osteoarthritis, left hand: Secondary | ICD-10-CM | POA: Diagnosis not present

## 2016-12-30 DIAGNOSIS — E7800 Pure hypercholesterolemia, unspecified: Secondary | ICD-10-CM | POA: Diagnosis not present

## 2016-12-30 DIAGNOSIS — K219 Gastro-esophageal reflux disease without esophagitis: Secondary | ICD-10-CM | POA: Diagnosis not present

## 2016-12-30 DIAGNOSIS — K5900 Constipation, unspecified: Secondary | ICD-10-CM | POA: Diagnosis not present

## 2016-12-30 DIAGNOSIS — Z0000 Encounter for general adult medical examination without abnormal findings: Secondary | ICD-10-CM | POA: Diagnosis not present

## 2016-12-30 DIAGNOSIS — Z9181 History of falling: Secondary | ICD-10-CM | POA: Diagnosis not present

## 2016-12-30 DIAGNOSIS — M19041 Primary osteoarthritis, right hand: Secondary | ICD-10-CM | POA: Diagnosis not present

## 2017-01-21 ENCOUNTER — Ambulatory Visit
Admission: RE | Admit: 2017-01-21 | Discharge: 2017-01-21 | Disposition: A | Discharge status: Home / Self Care | Payer: Medicare HMO | Source: Ambulatory Visit | Attending: Certified Nurse Midwife | Admitting: Certified Nurse Midwife

## 2017-01-21 DIAGNOSIS — Z1231 Encounter for screening mammogram for malignant neoplasm of breast: Secondary | ICD-10-CM | POA: Diagnosis not present

## 2017-02-09 ENCOUNTER — Telehealth: Payer: Self-pay | Admitting: Certified Nurse Midwife

## 2017-02-09 NOTE — Telephone Encounter (Signed)
Patient wants a recommendation for a holistic physician.  She was given one but that doctor is not taking any new patients.

## 2017-02-09 NOTE — Telephone Encounter (Signed)
Anna Lindsey, CNM -please advise. Patient was provided with Dr. Elmyra RicksKim's contact information in previous telephone encounter today.

## 2017-02-09 NOTE — Telephone Encounter (Signed)
Spoke with patient, provided contact information as seen below for Dr. Selena BattenKim. Patient thankful for return call.  [facility] [address] [place], [place] [address] Tel: [phone]  Routing to provider for final review. Patient is agreeable to disposition. Will close encounter.

## 2017-02-09 NOTE — Telephone Encounter (Signed)
Patient states DL gave her the name of a general doctor and she cannot find that person. She states it is a Dr. Selena BattenKim but does not know the name of the practice.

## 2017-02-11 NOTE — Telephone Encounter (Signed)
Dr. Vaughan or Ananias Pilgrimr. Comer LocketWanick

## 2017-02-11 NOTE — Telephone Encounter (Signed)
Spoke with patient, advised as seen below per Leota Sauerseborah Leonard, CNM. Provided contact information for providers. Patient thankful for return call. Patient verbalizes understanding.  Routing to provider for final review. Patient is agreeable to disposition. Will close encounter.

## 2017-04-13 DIAGNOSIS — R69 Illness, unspecified: Secondary | ICD-10-CM | POA: Diagnosis not present

## 2017-04-13 DIAGNOSIS — M797 Fibromyalgia: Secondary | ICD-10-CM | POA: Diagnosis not present

## 2017-04-13 DIAGNOSIS — R030 Elevated blood-pressure reading, without diagnosis of hypertension: Secondary | ICD-10-CM | POA: Diagnosis not present

## 2017-04-13 DIAGNOSIS — E785 Hyperlipidemia, unspecified: Secondary | ICD-10-CM | POA: Diagnosis not present

## 2017-04-21 DIAGNOSIS — L03114 Cellulitis of left upper limb: Secondary | ICD-10-CM | POA: Diagnosis not present

## 2017-04-21 DIAGNOSIS — W5501XA Bitten by cat, initial encounter: Secondary | ICD-10-CM | POA: Diagnosis not present

## 2017-06-17 ENCOUNTER — Encounter: Payer: Self-pay | Admitting: Certified Nurse Midwife

## 2017-06-17 ENCOUNTER — Other Ambulatory Visit (HOSPITAL_COMMUNITY)
Admission: RE | Admit: 2017-06-17 | Discharge: 2017-06-17 | Disposition: A | Discharge status: Home / Self Care | Payer: Medicare HMO | Source: Ambulatory Visit | Attending: Obstetrics & Gynecology | Admitting: Obstetrics & Gynecology

## 2017-06-17 ENCOUNTER — Ambulatory Visit (INDEPENDENT_AMBULATORY_CARE_PROVIDER_SITE_OTHER): Payer: Medicare HMO | Admitting: Certified Nurse Midwife

## 2017-06-17 VITALS — BP 120/62 | HR 70 | Resp 16 | Ht 60.25 in | Wt 113.0 lb

## 2017-06-17 DIAGNOSIS — N952 Postmenopausal atrophic vaginitis: Secondary | ICD-10-CM | POA: Diagnosis not present

## 2017-06-17 DIAGNOSIS — Z124 Encounter for screening for malignant neoplasm of cervix: Secondary | ICD-10-CM | POA: Insufficient documentation

## 2017-06-17 DIAGNOSIS — Z9889 Other specified postprocedural states: Secondary | ICD-10-CM | POA: Diagnosis not present

## 2017-06-17 DIAGNOSIS — Z01419 Encounter for gynecological examination (general) (routine) without abnormal findings: Secondary | ICD-10-CM | POA: Diagnosis not present

## 2017-06-17 NOTE — Progress Notes (Signed)
71 y.o. G42P1011 Married  Caucasian Fe here for annual exam. Menopausal no HRT. Denies vaginal bleeding or vaginal dryness. Heartburn better with Apple Cider use. Cardiology visit was good with no change in hypertensive medication. Has all labs there and is seen every 6  Month. Sees Dr Brock Bad for Celexa and Pamelor, stable at present. Not sexually active with spouse, but hopeful. Having some balance issues since ankle injury last year, but does not feel related. No other health issues today. Mother is 48 and lives with her, but doing well!  Patient's last menstrual period was 08/06/2000.          Sexually active: No.  The current method of family planning is post menopausal status.    Exercising: Yes.    stretching Smoker:  no  Health Maintenance: Pap:  06-12-15 neg, 06-16-16 neg History of Abnormal Pap: yes LEEP MMG:  01-21-17 category c density birads 1:neg Self Breast exams: occ Colonoscopy:  2014 neg f/u 74yrs, family hx BMD:   2014 TDaP:  2018 Shingles: no Pneumonia: 2015 Hep C and HIV: Hep c neg 2017, HIV neg yrs ago Labs: none    reports that she has never smoked. She has never used smokeless tobacco. She reports that she does not drink alcohol or use drugs.  Past Medical History:  Diagnosis Date  . Anxiety   . Broken ankle    crushed heel 2013  . Condyloma   . Depression   . Dysplasia of cervix, low grade (CIN 1) 1995   LEEP  . Fibromyalgia   . H/O: rheumatic fever   . Hepatitis B    with complete resolution- documentation - [facility]    Past Surgical History:  Procedure Laterality Date  . BLADDER SURGERY  1980  . EXPLORATORY LAPAROTOMY     Due to bladder injury with fall of a horse  . HYSTEROSCOPY  2004   D&C Polyp  . TONSILLECTOMY     childhood    Current Outpatient Prescriptions  Medication Sig Dispense Refill  . aspirin 81 MG tablet Take 81 mg by mouth daily.    Marland Kitchen atorvastatin (LIPITOR) 40 MG tablet Take 40 mg by mouth daily.    . Calcium Carbonate-Vitamin D  (CALCIUM + D PO) Take 600 mg by mouth daily.    . citalopram (CELEXA) 20 MG tablet TAKE 1 TABLET BY MOUTH ATBEDTIME. 90 tablet 1  . Multiple Vitamin (MULTIVITAMIN) tablet Take 1 tablet by mouth daily.    . nortriptyline (PAMELOR) 75 MG capsule Take 1 capsule (75 mg total) by mouth at bedtime. 90 capsule 1  . ondansetron (ZOFRAN-ODT) 8 MG disintegrating tablet Take 1 tablet (8 mg total) by mouth every 8 (eight) hours as needed for nausea. 15 tablet 0   No current facility-administered medications for this visit.     Family History  Problem Relation Age of Onset  . Anxiety disorder Mother   . Cancer Mother        skin  . Thyroid disease Mother   . Heart disease Mother        afib  . Depression Father   . Colon cancer Father   . Hypertension Father   . Depression Sister   . Kidney disease Maternal Grandmother   . Parkinson's disease Maternal Grandfather   . Diabetes Paternal Grandmother   . Heart disease Paternal Grandfather   . Breast cancer Paternal Aunt     ROS:  Pertinent items are noted in HPI.  Otherwise, a comprehensive ROS  was negative.  Exam:   LMP 08/06/2000    Ht Readings from Last 3 Encounters:  07/21/16 5' 0.25" (1.53 m)  06/16/16 5' 0.25" (1.53 m)  06/12/15 5' 1.25" (1.556 m)    General appearance: alert, cooperative and appears stated age Head: Normocephalic, without obvious abnormality, atraumatic Neck: no adenopathy, supple, symmetrical, trachea midline and thyroid normal to inspection and palpation Lungs: clear to auscultation bilaterally Breasts: normal appearance, no masses or tenderness, No nipple retraction or dimpling, No nipple discharge or bleeding, No axillary or supraclavicular adenopathy Heart: regular rate and rhythm Abdomen: soft, non-tender; no masses,  no organomegaly Extremities: extremities normal, atraumatic, no cyanosis or edema Skin: Skin color, texture, turgor normal. No rashes or lesions Lymph nodes: Cervical, supraclavicular, and  axillary nodes normal. No abnormal inguinal nodes palpated Neurologic: Grossly normal   Pelvic: External genitalia:  no lesions              Urethra:  normal appearing urethra with no masses, tenderness or lesions              Bartholin's and Skene's: normal                 Vagina: atrophic appearing vagina with normal color and discharge, no lesions              Cervix: no cervical motion tenderness and no lesions              Pap taken: no cervical motion tenderness, no lesions and normal appearance Bimanual Exam:  Uterus:  normal size, contour, position, consistency, mobility, non-tender              Adnexa: normal adnexa and no mass, fullness, tenderness               Rectovaginal: Confirms               Anus:  normal sphincter tone, no lesions  Chaperone present: yes  A:  Well Woman with normal exam  Menopausal no HRT  Atrophic vaginitis uses coconut /almond oil with good response  History of LEEP for abnormal pap > 20 years ago, patient desires pap yearly   Cardiology management of labs and medication for hypertension  Anxiety medication with PCP, stable  Height loss, BMD due  Balance issues  P:   Reviewed health and wellness pertinent to exam  Aware of need to evaluate if vaginal bleeding  Will advise if issues occur  Continue follow up with MD as indicated  Patient to call and schedule BMD, work on stretches and gentle yoga  Discussed PT evaluation of balance and can do at her Orthopedic office. She will call and make appointment. Encouraged to rise slowly and avoid loose rugs in home.  Pap smear: yes per request   counseled on breast self exam, mammography screening, osteoporosis, adequate intake of calcium and vitamin D, diet and exercise, Kegel's exercises  return annually or prn  An After Visit Summary was printed and given to the patient.

## 2017-06-17 NOTE — Patient Instructions (Signed)

## 2017-06-20 LAB — CYTOLOGY - PAP: DIAGNOSIS: NEGATIVE

## 2017-06-27 ENCOUNTER — Ambulatory Visit (HOSPITAL_COMMUNITY): Payer: Self-pay | Admitting: Psychiatry

## 2017-07-05 ENCOUNTER — Encounter (HOSPITAL_COMMUNITY): Payer: Self-pay | Admitting: Psychiatry

## 2017-07-05 ENCOUNTER — Ambulatory Visit (INDEPENDENT_AMBULATORY_CARE_PROVIDER_SITE_OTHER): Payer: Medicare HMO | Admitting: Psychiatry

## 2017-07-05 DIAGNOSIS — F330 Major depressive disorder, recurrent, mild: Secondary | ICD-10-CM | POA: Diagnosis not present

## 2017-07-05 DIAGNOSIS — Z818 Family history of other mental and behavioral disorders: Secondary | ICD-10-CM | POA: Diagnosis not present

## 2017-07-05 DIAGNOSIS — R69 Illness, unspecified: Secondary | ICD-10-CM | POA: Diagnosis not present

## 2017-07-05 MED ORDER — CITALOPRAM HYDROBROMIDE 20 MG PO TABS: ORAL_TABLET | ORAL | 1 refills | Status: DC

## 2017-07-05 MED ORDER — NORTRIPTYLINE HCL 75 MG PO CAPS: 75.0000 mg | ORAL_CAPSULE | Freq: Every day | ORAL | 1 refills | Status: DC

## 2017-07-05 NOTE — Progress Notes (Signed)
BH MD/PA/NP OP Progress Note  07/05/2017 10:15 AM Anna Lindsey  MRN:  [id number]  Chief Complaint:  Subjective:  I am doing better.  HPI: Patient came for her follow-up appointment.  She is doing better on her medication.  Her mother is now 643 years old.  Recently she had per day.  Patient told that she still have struggled dealing with the mother but now hospice comes to help her.  She is very pleased with the hospice care.  She is able to have some time to herself.  She also endorsed that her daughter who lives close by also helping some days.  Patient is more relaxed and less anxious.  She is sleeping better with the medication.  She denies any irritability, anger, mania or any crying spells.  She denies any suicidal thoughts or any feeling of hopelessness.  She is taking nortriptyline and Celexa.  She has no shakes, tremors or any EPS.  She endorse most of the time her husband is very supportive but sometime he also gets frustrated.  Patient told her 70 years old mother is fairly stable but she had lost weight from the past.  Patient denies drinking alcohol or using any illegal substances.  Her mother lives with her.  Patient has one sister who is sick and lives in [place].  Visit Diagnosis:    ICD-10-CM   1. Mild episode of recurrent major depressive disorder (HCC) F33.0 citalopram (CELEXA) 20 MG tablet    nortriptyline (PAMELOR) 75 MG capsule    Past Psychiatric History: Reviewed.  Past Medical History:  Past Medical History:  Diagnosis Date  . Anxiety   . Broken ankle    crushed heel 2013  . Condyloma   . Depression   . Dysplasia of cervix, low grade (CIN 1) 1995   LEEP  . Fibromyalgia   . H/O: rheumatic fever   . Hepatitis B    with complete resolution- documentation - [facility]    Past Surgical History:  Procedure Laterality Date  . BLADDER SURGERY  1980  . EXPLORATORY LAPAROTOMY     Due to bladder injury with fall of a horse  . HYSTEROSCOPY  2004   D&C Polyp  . LEEP     . TONSILLECTOMY     childhood    Family Psychiatric History: Reviewed.  Family History:  Family History  Problem Relation Age of Onset  . Anxiety disorder Mother   . Cancer Mother        skin  . Thyroid disease Mother   . Heart disease Mother        afib  . Depression Father   . Colon cancer Father   . Hypertension Father   . Depression Sister   . Kidney disease Maternal Grandmother   . Parkinson's disease Maternal Grandfather   . Diabetes Paternal Grandmother   . Heart disease Paternal Grandfather   . Breast cancer Paternal Aunt     Social History:  Social History   Social History  . Marital status: Married    Spouse name: N/A  . Number of children: N/A  . Years of education: N/A   Social History Main Topics  . Smoking status: Never Smoker  . Smokeless tobacco: Never Used  . Alcohol use No  . Drug use: No  . Sexual activity: Not Currently    Partners: Male    Birth control/ protection: Post-menopausal   Other Topics Concern  . None   Social History Narrative   Exercise  stretching once a day for 5 minutes    Allergies:  Allergies  Allergen Reactions  . Ciprofloxacin Anxiety    Metabolic Disorder Labs: No results found for: HGBA1C, MPG No results found for: PROLACTIN Lab Results  Component Value Date   CHOL 216 (H) 04/02/2015   TRIG 60 04/02/2015   HDL 58 04/02/2015   CHOLHDL 3.7 04/02/2015   VLDL 12 04/02/2015   LDLCALC 146 (H) 04/02/2015   LDLCALC 131 (H) 10/28/2014     Current Medications: Current Outpatient Prescriptions  Medication Sig Dispense Refill  . aspirin 81 MG tablet Take 81 mg by mouth daily.    Marland Kitchen. atorvastatin (LIPITOR) 40 MG tablet Take 40 mg by mouth daily.    . Calcium Carbonate-Vitamin D (CALCIUM + D PO) Take 600 mg by mouth daily.    . citalopram (CELEXA) 20 MG tablet TAKE 1 TABLET BY MOUTH ATBEDTIME. 90 tablet 1  . Multiple Vitamin (MULTIVITAMIN) tablet Take 1 tablet by mouth daily.    . nortriptyline (PAMELOR) 75 MG  capsule Take 1 capsule (75 mg total) by mouth at bedtime. 90 capsule 1   No current facility-administered medications for this visit.     Neurologic: Headache: No Seizure: No Paresthesias: No  Musculoskeletal: Strength & Muscle Tone: within normal limits Gait & Station: normal Patient leans: N/A  Psychiatric Specialty Exam: ROS  Blood pressure 110/68, pulse 82, height 5' 1.5" (1.562 m), weight 111 lb 6.4 oz (50.5 kg), last menstrual period 08/06/2000.Body mass index is 20.71 kg/m.  General Appearance: Casual  Eye Contact:  Good  Speech:  Clear and Coherent  Volume:  Normal  Mood:  Anxious  Affect:  Congruent  Thought Process:  Goal Directed  Orientation:  Full (Time, Place, and Person)  Thought Content: WDL and Logical   Suicidal Thoughts:  No  Homicidal Thoughts:  No  Memory:  Immediate;   Good Recent;   Good Remote;   Good  Judgement:  Good  Insight:  Good  Psychomotor Activity:  Normal  Concentration:  Concentration: Good and Attention Span: Good  Recall:  Good  Fund of Knowledge: Good  Language: Good  Akathisia:  No  Handed:  Right  AIMS (if indicated):  0  Assets:  Communication Skills Desire for Improvement Housing Resilience Social Support  ADL's:  Intact  Cognition: WNL  Sleep:  ok    Assessment: Major depressive disorder, recurrent  Plan: Patient is a stable on her current psychiatric medication.  I will continue nortriptyline 75 mg at bedtime and Celexa 20 mg daily.  Patient has no side effects including tremors shakes or any EPS.  She recently seen her primary care and there has been no changes in the medication.  Discussed medication side effects and benefits.  Recommended to call us back if she has any question or any concern.  Follow-up in 6 months.  Cyntia Staley T., MD 07/05/2017, 10:15 AM

## 2017-08-16 DIAGNOSIS — I00 Rheumatic fever without heart involvement: Secondary | ICD-10-CM | POA: Diagnosis not present

## 2017-08-16 DIAGNOSIS — M797 Fibromyalgia: Secondary | ICD-10-CM | POA: Diagnosis not present

## 2017-08-16 DIAGNOSIS — I10 Essential (primary) hypertension: Secondary | ICD-10-CM | POA: Diagnosis not present

## 2017-08-16 DIAGNOSIS — R69 Illness, unspecified: Secondary | ICD-10-CM | POA: Diagnosis not present

## 2017-08-16 DIAGNOSIS — E785 Hyperlipidemia, unspecified: Secondary | ICD-10-CM | POA: Diagnosis not present

## 2017-09-16 ENCOUNTER — Emergency Department (HOSPITAL_COMMUNITY): Payer: Medicare HMO

## 2017-09-16 ENCOUNTER — Encounter (HOSPITAL_COMMUNITY): Payer: Self-pay | Admitting: *Deleted

## 2017-09-16 DIAGNOSIS — W010XXA Fall on same level from slipping, tripping and stumbling without subsequent striking against object, initial encounter: Secondary | ICD-10-CM | POA: Diagnosis not present

## 2017-09-16 DIAGNOSIS — Z7982 Long term (current) use of aspirin: Secondary | ICD-10-CM | POA: Insufficient documentation

## 2017-09-16 DIAGNOSIS — Y929 Unspecified place or not applicable: Secondary | ICD-10-CM | POA: Diagnosis not present

## 2017-09-16 DIAGNOSIS — S6991XA Unspecified injury of right wrist, hand and finger(s), initial encounter: Secondary | ICD-10-CM | POA: Diagnosis present

## 2017-09-16 DIAGNOSIS — S52501A Unspecified fracture of the lower end of right radius, initial encounter for closed fracture: Secondary | ICD-10-CM | POA: Insufficient documentation

## 2017-09-16 DIAGNOSIS — M7989 Other specified soft tissue disorders: Secondary | ICD-10-CM | POA: Diagnosis not present

## 2017-09-16 DIAGNOSIS — Y999 Unspecified external cause status: Secondary | ICD-10-CM | POA: Insufficient documentation

## 2017-09-16 DIAGNOSIS — Y939 Activity, unspecified: Secondary | ICD-10-CM | POA: Insufficient documentation

## 2017-09-16 DIAGNOSIS — Z79899 Other long term (current) drug therapy: Secondary | ICD-10-CM | POA: Diagnosis not present

## 2017-09-16 MED ORDER — IBUPROFEN 200 MG PO TABS
400.0000 mg | ORAL_TABLET | Freq: Once | ORAL | Status: AC | PRN
  Administered 2017-09-16: 400 mg via ORAL
  Filled 2017-09-16: qty 2

## 2017-09-16 NOTE — ED Triage Notes (Signed)
Pt is ambluatory to triage room with steady gait. Pt states she was walking the dog, and she tripped and fell onto her right wrist. Also c/o some bruising in the right lateral thigh area. Abrasions to the left knee, right elbow, and right lower leg. No LOCNo meds PTA. Pt is driving herself.

## 2017-09-17 ENCOUNTER — Emergency Department (HOSPITAL_COMMUNITY): Payer: Medicare HMO

## 2017-09-17 ENCOUNTER — Emergency Department (HOSPITAL_COMMUNITY)
Admission: EM | Admit: 2017-09-17 | Discharge: 2017-09-17 | Disposition: A | Discharge status: Home / Self Care | Payer: Medicare HMO | Attending: Emergency Medicine | Admitting: Emergency Medicine

## 2017-09-17 DIAGNOSIS — S52501A Unspecified fracture of the lower end of right radius, initial encounter for closed fracture: Secondary | ICD-10-CM | POA: Diagnosis not present

## 2017-09-17 MED ORDER — OXYCODONE-ACETAMINOPHEN 5-325 MG PO TABS: 2.0000 | ORAL_TABLET | ORAL | 0 refills | Status: DC | PRN

## 2017-09-17 NOTE — ED Provider Notes (Signed)
[facility]-EMERGENCY DEPT Provider Note   CSN: [id number] Arrival date & time: 09/16/17  1901     History   Chief Complaint Chief Complaint  Patient presents with  . Wrist Pain    HPI Anna Lindsey is a 71 y.o. female.  71 year old female presents with right wrist pain after falling today while walking her dog. No head or neck injury. Pain is sharp and worse with any movement of her wrist. Also complains of right side discomfort without distal numbness or tingling. Has some pain and swelling to her right third digit. Denies any elbow or shoulder pain. No hip discomfort. Symptoms worse with the minimum better with rest and no treatment used prior to arrival      Past Medical History:  Diagnosis Date  . Anxiety   . Broken ankle    crushed heel 2013  . Condyloma   . Depression   . Dysplasia of cervix, low grade (CIN 1) 1995   LEEP  . Fibromyalgia   . H/O: rheumatic fever   . Hepatitis B    with complete resolution- documentation - [facility]    Patient Active Problem List   Diagnosis Date Noted  . Depressive disorder, not elsewhere classified 01/16/2013    Past Surgical History:  Procedure Laterality Date  . BLADDER SURGERY  1980  . EXPLORATORY LAPAROTOMY     Due to bladder injury with fall of a horse  . HYSTEROSCOPY  2004   D&C Polyp  . LEEP    . TONSILLECTOMY     childhood    OB History    Gravida Para Term Preterm AB Living   SAB TAB Ectopic Multiple Live Births     1             Home Medications    Prior to Admission medications   Medication Sig Start Date End Date Taking? Authorizing Provider  aspirin 81 MG tablet Take 81 mg by mouth daily.    [provider]  atorvastatin (LIPITOR) 40 MG tablet Take 40 mg by mouth daily. 05/02/15   [provider]  Calcium Carbonate-Vitamin D (CALCIUM + D PO) Take 600 mg by mouth daily.    [provider]  citalopram (CELEXA) 20 MG tablet TAKE 1 TABLET BY MOUTH ATBEDTIME. 07/05/17    Arfeen, Phillips Grout, MD  Multiple Vitamin (MULTIVITAMIN) tablet Take 1 tablet by mouth daily.    [provider]  nortriptyline (PAMELOR) 75 MG capsule Take 1 capsule (75 mg total) by mouth at bedtime. 07/05/17   Arfeen, Phillips Grout, MD    Family History Family History  Problem Relation Age of Onset  . Anxiety disorder Mother   . Cancer Mother        skin  . Thyroid disease Mother   . Heart disease Mother        afib  . Depression Father   . Colon cancer Father   . Hypertension Father   . Depression Sister   . Kidney disease Maternal Grandmother   . Parkinson's disease Maternal Grandfather   . Diabetes Paternal Grandmother   . Heart disease Paternal Grandfather   . Breast cancer Paternal Aunt     Social History Social History  Substance Use Topics  . Smoking status: Never Smoker  . Smokeless tobacco: Never Used  . Alcohol use No     Allergies   Ciprofloxacin   Review of Systems Review of Systems  All  other systems reviewed and are negative.    Physical Exam Updated Vital Signs BP (!) 126/97 (BP Location: Left Arm)   Pulse 86   Resp 18   LMP 08/06/2000   SpO2 96%   Physical Exam  Constitutional: She is oriented to person, place, and time. She appears well-developed and well-nourished.  Non-toxic appearance. No distress.  HENT:  Head: Normocephalic and atraumatic.  Eyes: Pupils are equal, round, and reactive to light. Conjunctivae, EOM and lids are normal.  Neck: Normal range of motion. Neck supple. No tracheal deviation present. No thyroid mass present.  Cardiovascular: Normal rate, regular rhythm and normal heart sounds.  Exam reveals no gallop.   No murmur heard. Pulmonary/Chest: Effort normal and breath sounds normal. No stridor. No respiratory distress. She has no decreased breath sounds. She has no wheezes. She has no rhonchi. She has no rales.  Abdominal: Soft. Normal appearance and bowel sounds are normal. She exhibits no distension. There is no  tenderness. There is no rebound and no CVA tenderness.  Musculoskeletal: Normal range of motion. She exhibits no edema or tenderness.       Hands:      Legs: Neurovascular status intact at right hand  Range of motion at right shoulder and right elbow without deformity.  Neurological: She is alert and oriented to person, place, and time. She has normal strength. No cranial nerve deficit or sensory deficit. GCS eye subscore is 4. GCS verbal subscore is 5. GCS motor subscore is 6.  Skin: Skin is warm and dry. No abrasion and no rash noted.  Psychiatric: She has a normal mood and affect. Her speech is normal and behavior is normal.  Nursing note and vitals reviewed.    ED Treatments / Results  Labs (all labs ordered are listed, but only abnormal results are displayed) Labs Reviewed - No data to display  EKG  EKG Interpretation None       Radiology Dg Wrist Complete Right  Result Date: 09/16/2017 CLINICAL DATA:  Acute onset of right lateral posterior wrist pain, with bruising and swelling. Status post fall while walking dog. Initial encounter. EXAM: RIGHT WRIST - COMPLETE 3+ VIEW COMPARISON:  Right forearm radiographs performed 07/13/2012 FINDINGS: There is a mildly impacted fracture at the distal radial metaphysis. No significant angulation is seen. There is question of a small ulnar styloid fracture. The carpal rows appear grossly intact, and demonstrate normal alignment. Mild soft tissue swelling is noted about the wrist. IMPRESSION: Mildly impacted fracture of the distal radial metaphysis, without significant angulation. Question of small ulnar styloid fracture. Electronically Signed   By: Roanna Raider M.D.   On: 09/16/2017 22:19   Dg Femur Min 2 Views Right  Result Date: 09/16/2017 CLINICAL DATA:  Status post fall while walking dog, with right femur pain, bruising and swelling. Initial encounter. EXAM: RIGHT FEMUR 2 VIEWS COMPARISON:  None. FINDINGS: There is no evidence of  fracture or dislocation. The right femur appears intact. The right femoral head remains seated at the acetabulum. The knee joint is unremarkable. No knee joint effusion is identified. Scattered vascular calcifications are seen. IMPRESSION: 1. No evidence of fracture or dislocation. 2. Scattered vascular calcifications seen. Electronically Signed   By: Roanna Raider M.D.   On: 09/16/2017 22:20    Procedures Procedures (including critical care time)  Medications Ordered in ED Medications  ibuprofen (ADVIL,MOTRIN) tablet 400 mg (400 mg Oral Given 09/16/17 2151)     Initial Impression / Assessment and Plan / ED  Course  I have reviewed the triage vital signs and the nursing notes.  Pertinent labs & imaging results that were available during my care of the patient were reviewed by me and considered in my medical decision making (see chart for details).     An x-ray negative for finger fracture but does so distal radius fracture. Neurovascularly intact. Splint applied by orthopedic tech. Give referral to hand surgeon on call  Final Clinical Impressions(s) / ED Diagnoses   Final diagnoses:  None    New Prescriptions New Prescriptions   No medications on file     Lorre Nick, MD 09/17/17 [id number]

## 2017-09-17 NOTE — Progress Notes (Signed)
Orthopedic Tech Progress Note Patient Details:  Anna Lindsey 08-Oct-1946 [id number]  Ortho Devices Type of Ortho Device: Arm sling, Volar splint Ortho Device/Splint Location: rue Ortho Device/Splint Interventions: Ordered, Application, Adjustment   Trinna Post 09/17/2017, 2:56 AM

## 2017-09-19 DIAGNOSIS — M25631 Stiffness of right wrist, not elsewhere classified: Secondary | ICD-10-CM | POA: Insufficient documentation

## 2017-09-19 DIAGNOSIS — M25431 Effusion, right wrist: Secondary | ICD-10-CM | POA: Diagnosis not present

## 2017-09-19 DIAGNOSIS — M25531 Pain in right wrist: Secondary | ICD-10-CM | POA: Diagnosis not present

## 2017-09-19 DIAGNOSIS — S52571A Other intraarticular fracture of lower end of right radius, initial encounter for closed fracture: Secondary | ICD-10-CM | POA: Diagnosis not present

## 2017-09-27 DIAGNOSIS — S52571A Other intraarticular fracture of lower end of right radius, initial encounter for closed fracture: Secondary | ICD-10-CM | POA: Diagnosis not present

## 2017-09-27 DIAGNOSIS — S52571D Other intraarticular fracture of lower end of right radius, subsequent encounter for closed fracture with routine healing: Secondary | ICD-10-CM | POA: Diagnosis not present

## 2017-10-02 DIAGNOSIS — R69 Illness, unspecified: Secondary | ICD-10-CM | POA: Diagnosis not present

## 2017-10-12 DIAGNOSIS — S52571D Other intraarticular fracture of lower end of right radius, subsequent encounter for closed fracture with routine healing: Secondary | ICD-10-CM | POA: Diagnosis not present

## 2017-10-12 DIAGNOSIS — S52571A Other intraarticular fracture of lower end of right radius, initial encounter for closed fracture: Secondary | ICD-10-CM | POA: Diagnosis not present

## 2017-10-12 DIAGNOSIS — G5601 Carpal tunnel syndrome, right upper limb: Secondary | ICD-10-CM | POA: Diagnosis not present

## 2017-10-19 DIAGNOSIS — G5601 Carpal tunnel syndrome, right upper limb: Secondary | ICD-10-CM | POA: Diagnosis not present

## 2017-10-19 DIAGNOSIS — S52571D Other intraarticular fracture of lower end of right radius, subsequent encounter for closed fracture with routine healing: Secondary | ICD-10-CM | POA: Diagnosis not present

## 2017-10-19 DIAGNOSIS — G5621 Lesion of ulnar nerve, right upper limb: Secondary | ICD-10-CM | POA: Diagnosis not present

## 2017-10-19 DIAGNOSIS — M5412 Radiculopathy, cervical region: Secondary | ICD-10-CM | POA: Diagnosis not present

## 2017-10-19 DIAGNOSIS — G5603 Carpal tunnel syndrome, bilateral upper limbs: Secondary | ICD-10-CM | POA: Diagnosis not present

## 2017-10-19 DIAGNOSIS — G8929 Other chronic pain: Secondary | ICD-10-CM | POA: Diagnosis not present

## 2017-10-26 DIAGNOSIS — G5601 Carpal tunnel syndrome, right upper limb: Secondary | ICD-10-CM | POA: Diagnosis not present

## 2017-10-26 DIAGNOSIS — S52571D Other intraarticular fracture of lower end of right radius, subsequent encounter for closed fracture with routine healing: Secondary | ICD-10-CM | POA: Diagnosis not present

## 2017-11-11 DIAGNOSIS — G5601 Carpal tunnel syndrome, right upper limb: Secondary | ICD-10-CM | POA: Diagnosis not present

## 2017-11-11 DIAGNOSIS — S52571D Other intraarticular fracture of lower end of right radius, subsequent encounter for closed fracture with routine healing: Secondary | ICD-10-CM | POA: Diagnosis not present

## 2017-12-12 DIAGNOSIS — I159 Secondary hypertension, unspecified: Secondary | ICD-10-CM | POA: Diagnosis not present

## 2017-12-12 DIAGNOSIS — E785 Hyperlipidemia, unspecified: Secondary | ICD-10-CM | POA: Diagnosis not present

## 2017-12-12 DIAGNOSIS — I10 Essential (primary) hypertension: Secondary | ICD-10-CM | POA: Diagnosis not present

## 2017-12-12 DIAGNOSIS — M797 Fibromyalgia: Secondary | ICD-10-CM | POA: Diagnosis not present

## 2017-12-12 DIAGNOSIS — R69 Illness, unspecified: Secondary | ICD-10-CM | POA: Diagnosis not present

## 2017-12-12 DIAGNOSIS — I00 Rheumatic fever without heart involvement: Secondary | ICD-10-CM | POA: Diagnosis not present

## 2017-12-16 DIAGNOSIS — G5601 Carpal tunnel syndrome, right upper limb: Secondary | ICD-10-CM | POA: Diagnosis not present

## 2017-12-16 DIAGNOSIS — S52571D Other intraarticular fracture of lower end of right radius, subsequent encounter for closed fracture with routine healing: Secondary | ICD-10-CM | POA: Diagnosis not present

## 2017-12-19 DIAGNOSIS — M25431 Effusion, right wrist: Secondary | ICD-10-CM | POA: Diagnosis not present

## 2017-12-19 DIAGNOSIS — R29898 Other symptoms and signs involving the musculoskeletal system: Secondary | ICD-10-CM | POA: Insufficient documentation

## 2017-12-19 DIAGNOSIS — M25531 Pain in right wrist: Secondary | ICD-10-CM | POA: Diagnosis not present

## 2017-12-19 DIAGNOSIS — M25631 Stiffness of right wrist, not elsewhere classified: Secondary | ICD-10-CM | POA: Diagnosis not present

## 2017-12-21 ENCOUNTER — Other Ambulatory Visit: Payer: Self-pay | Admitting: Certified Nurse Midwife

## 2017-12-21 DIAGNOSIS — Z1231 Encounter for screening mammogram for malignant neoplasm of breast: Secondary | ICD-10-CM

## 2017-12-26 ENCOUNTER — Ambulatory Visit (INDEPENDENT_AMBULATORY_CARE_PROVIDER_SITE_OTHER): Payer: Medicare HMO | Admitting: Psychiatry

## 2017-12-26 ENCOUNTER — Encounter (HOSPITAL_COMMUNITY): Payer: Self-pay | Admitting: Psychiatry

## 2017-12-26 DIAGNOSIS — Z79899 Other long term (current) drug therapy: Secondary | ICD-10-CM

## 2017-12-26 DIAGNOSIS — Z636 Dependent relative needing care at home: Secondary | ICD-10-CM

## 2017-12-26 DIAGNOSIS — F330 Major depressive disorder, recurrent, mild: Secondary | ICD-10-CM

## 2017-12-26 DIAGNOSIS — Z818 Family history of other mental and behavioral disorders: Secondary | ICD-10-CM | POA: Diagnosis not present

## 2017-12-26 DIAGNOSIS — R69 Illness, unspecified: Secondary | ICD-10-CM | POA: Diagnosis not present

## 2017-12-26 MED ORDER — CITALOPRAM HYDROBROMIDE 20 MG PO TABS: ORAL_TABLET | ORAL | 1 refills | Status: DC

## 2017-12-26 MED ORDER — NORTRIPTYLINE HCL 75 MG PO CAPS: 75.0000 mg | ORAL_CAPSULE | Freq: Every day | ORAL | 1 refills | Status: DC

## 2017-12-26 NOTE — Progress Notes (Signed)
BH MD/PA/NP OP Progress Note  12/26/2017 2:07 PM Anna Lindsey  MRN:  [id number]  Chief Complaint: I am doing so-so.  Sometime I gets tired.  HPI: Patient came for her follow-up appointment.  She is taking her medication and denies any side effects.  She is a primary take care of her mother who lives with her and in April her mother will be 71 years old.  Patient admitted sometimes difficult because her mother gets irritable and very demanding but she has no other choice.  Some days she get help from hospice and there are days when her husband stays with the mother so she can do grocery and shopping.  Patient denies any side effects of the medication.  Patient denies any mania, psychosis, crying spells.  She feels current medicine working very well.  She does not want to change the medication.  She has no shakes or any tremors.  Her sleep is good.  Her appetite is okay.  Her energy level is fair.  Patient had a fall few months ago and she required cast but she is weaning herself off from it.  She is not taking any pain medication.  Patient has a daughter who also some time helps her.  Patient denies drinking alcohol or using any illegal substances.  Visit Diagnosis:    ICD-10-CM   1. Mild episode of recurrent major depressive disorder (HCC) F33.0 nortriptyline (PAMELOR) 75 MG capsule    citalopram (CELEXA) 20 MG tablet    Past Psychiatric History: Reviewed.  Past Medical History:  Past Medical History:  Diagnosis Date  . Anxiety   . Broken ankle    crushed heel 2013  . Condyloma   . Depression   . Dysplasia of cervix, low grade (CIN 1) 1995   LEEP  . Fibromyalgia   . H/O: rheumatic fever   . Hepatitis B    with complete resolution- documentation - [facility]    Past Surgical History:  Procedure Laterality Date  . BLADDER SURGERY  1980  . EXPLORATORY LAPAROTOMY     Due to bladder injury with fall of a horse  . HYSTEROSCOPY  2004   D&C Polyp  . LEEP    . TONSILLECTOMY     childhood     Family Psychiatric History: Reviewed.  Family History:  Family History  Problem Relation Age of Onset  . Anxiety disorder Mother   . Cancer Mother        skin  . Thyroid disease Mother   . Heart disease Mother        afib  . Depression Father   . Colon cancer Father   . Hypertension Father   . Depression Sister   . Kidney disease Maternal Grandmother   . Parkinson's disease Maternal Grandfather   . Diabetes Paternal Grandmother   . Heart disease Paternal Grandfather   . Breast cancer Paternal Aunt     Social History:  Social History   Socioeconomic History  . Marital status: Married    Spouse name: None  . Number of children: None  . Years of education: None  . Highest education level: None  Social Needs  . Financial resource strain: None  . Food insecurity - worry: None  . Food insecurity - inability: None  . Transportation needs - medical: None  . Transportation needs - non-medical: None  Occupational History  . None  Tobacco Use  . Smoking status: Never Smoker  . Smokeless tobacco: Never Used  Substance and  Sexual Activity  . Alcohol use: No    Alcohol/week: 0.0 oz  . Drug use: No  . Sexual activity: Not Currently    Partners: Male    Birth control/protection: Post-menopausal  Other Topics Concern  . None  Social History Narrative   Exercise stretching once a day for 5 minutes    Allergies:  Allergies  Allergen Reactions  . Ciprofloxacin Anxiety    Metabolic Disorder Labs: No results found for: HGBA1C, MPG No results found for: PROLACTIN Lab Results  Component Value Date   CHOL 216 (H) 04/02/2015   TRIG 60 04/02/2015   HDL 58 04/02/2015   CHOLHDL 3.7 04/02/2015   VLDL 12 04/02/2015   LDLCALC 146 (H) 04/02/2015   LDLCALC 131 (H) 10/28/2014   Lab Results  Component Value Date   TSH 0.93 06/16/2016   TSH 1.324 10/28/2014    Therapeutic Level Labs: No results found for: LITHIUM No results found for: VALPROATE No components found  for:  CBMZ  Current Medications: Current Outpatient Medications  Medication Sig Dispense Refill  . aspirin 81 MG tablet Take 81 mg by mouth daily.    Marland Kitchen atorvastatin (LIPITOR) 40 MG tablet Take 40 mg by mouth daily.    . Calcium Carbonate-Vitamin D (CALCIUM + D PO) Take 600 mg by mouth daily.    . citalopram (CELEXA) 20 MG tablet TAKE 1 TABLET BY MOUTH ATBEDTIME. 90 tablet 1  . Multiple Vitamin (MULTIVITAMIN) tablet Take 1 tablet by mouth daily.    . nortriptyline (PAMELOR) 75 MG capsule Take 1 capsule (75 mg total) by mouth at bedtime. 90 capsule 1  . oxyCODONE-acetaminophen (PERCOCET/ROXICET) 5-325 MG tablet Take 2 tablets by mouth every 4 (four) hours as needed for severe pain. 15 tablet 0  . pseudoephedrine-acetaminophen (TYLENOL SINUS) 30-500 MG TABS tablet Take 2 tablets by mouth every 4 (four) hours as needed (sinus pain).     No current facility-administered medications for this visit.      Musculoskeletal: Strength & Muscle Tone: within normal limits Gait & Station: normal Patient leans: N/A  Psychiatric Specialty Exam: ROS  Blood pressure 139/77, pulse 79, temperature 98.5 F (36.9 C), height 5' 1.75" (1.568 m), weight 114 lb 12.8 oz (52.1 kg), last menstrual period 08/06/2000, SpO2 100 %.Body mass index is 21.17 kg/m.  General Appearance: Casual  Eye Contact:  Good  Speech:  Clear and Coherent  Volume:  Normal  Mood:  Euthymic  Affect:  Congruent  Thought Process:  Goal Directed  Orientation:  Full (Time, Place, and Person)  Thought Content: Logical   Suicidal Thoughts:  No  Homicidal Thoughts:  No  Memory:  Immediate;   Good Recent;   Good Remote;   Good  Judgement:  Good  Insight:  Good  Psychomotor Activity:  Normal  Concentration:  Concentration: Good and Attention Span: Good  Recall:  Good  Fund of Knowledge: Good  Language: Good  Akathisia:  No  Handed:  Right  AIMS (if indicated): not done  Assets:  Communication Skills Desire for  Improvement Housing Resilience Social Support  ADL's:  Intact  Cognition: WNL  Sleep:  Good   Screenings: PHQ2-9     Office Visit from 07/21/2016 in Primary Care at [facility] Office Visit from 05/15/2015 in Primary Care at [facility] Visit from 10/28/2014 in Primary Care at [facility] Total Score  0  0  1  PHQ-9 Total Score  No data  No data  2  Assessment and Plan: Major depressive disorder, recurrent.  Anxiety disorder NOS.  Patient is a stable on her current medication.  I reviewed collateral information from other provider.  I will continue nortriptyline 75 mg at bedtime and Celexa 20 mg daily.  Patient has no tremors shakes or any EPS.  She is not interested in counseling.  Discussed medication side effects and benefits.  Recommended to call us back if she has any question or any concern.  Follow-up in 6 months.   Cleotis Nipper, MD 12/26/2017, 2:07 PM

## 2018-01-23 ENCOUNTER — Ambulatory Visit
Admission: RE | Admit: 2018-01-23 | Discharge: 2018-01-23 | Disposition: A | Discharge status: Home / Self Care | Payer: Medicare HMO | Source: Ambulatory Visit | Attending: Certified Nurse Midwife | Admitting: Certified Nurse Midwife

## 2018-01-23 DIAGNOSIS — Z1231 Encounter for screening mammogram for malignant neoplasm of breast: Secondary | ICD-10-CM | POA: Diagnosis not present

## 2018-01-27 DIAGNOSIS — M25631 Stiffness of right wrist, not elsewhere classified: Secondary | ICD-10-CM | POA: Diagnosis not present

## 2018-01-27 DIAGNOSIS — S52571D Other intraarticular fracture of lower end of right radius, subsequent encounter for closed fracture with routine healing: Secondary | ICD-10-CM | POA: Diagnosis not present

## 2018-01-27 DIAGNOSIS — G5601 Carpal tunnel syndrome, right upper limb: Secondary | ICD-10-CM | POA: Diagnosis not present

## 2018-02-02 DIAGNOSIS — R69 Illness, unspecified: Secondary | ICD-10-CM | POA: Diagnosis not present

## 2018-02-02 DIAGNOSIS — K5900 Constipation, unspecified: Secondary | ICD-10-CM | POA: Diagnosis not present

## 2018-02-02 DIAGNOSIS — M797 Fibromyalgia: Secondary | ICD-10-CM | POA: Diagnosis not present

## 2018-02-02 DIAGNOSIS — F419 Anxiety disorder, unspecified: Secondary | ICD-10-CM | POA: Diagnosis not present

## 2018-02-02 DIAGNOSIS — R32 Unspecified urinary incontinence: Secondary | ICD-10-CM | POA: Diagnosis not present

## 2018-02-02 DIAGNOSIS — G8929 Other chronic pain: Secondary | ICD-10-CM | POA: Diagnosis not present

## 2018-02-02 DIAGNOSIS — Z809 Family history of malignant neoplasm, unspecified: Secondary | ICD-10-CM | POA: Diagnosis not present

## 2018-02-02 DIAGNOSIS — E785 Hyperlipidemia, unspecified: Secondary | ICD-10-CM | POA: Diagnosis not present

## 2018-02-02 DIAGNOSIS — Z7982 Long term (current) use of aspirin: Secondary | ICD-10-CM | POA: Diagnosis not present

## 2018-02-10 DIAGNOSIS — L02818 Cutaneous abscess of other sites: Secondary | ICD-10-CM | POA: Diagnosis not present

## 2018-04-10 DIAGNOSIS — R69 Illness, unspecified: Secondary | ICD-10-CM | POA: Diagnosis not present

## 2018-04-10 DIAGNOSIS — I159 Secondary hypertension, unspecified: Secondary | ICD-10-CM | POA: Diagnosis not present

## 2018-04-10 DIAGNOSIS — M797 Fibromyalgia: Secondary | ICD-10-CM | POA: Diagnosis not present

## 2018-04-10 DIAGNOSIS — E785 Hyperlipidemia, unspecified: Secondary | ICD-10-CM | POA: Diagnosis not present

## 2018-04-10 DIAGNOSIS — I00 Rheumatic fever without heart involvement: Secondary | ICD-10-CM | POA: Diagnosis not present

## 2018-06-21 ENCOUNTER — Encounter: Payer: Self-pay | Admitting: Certified Nurse Midwife

## 2018-06-21 ENCOUNTER — Ambulatory Visit (INDEPENDENT_AMBULATORY_CARE_PROVIDER_SITE_OTHER): Payer: Medicare HMO | Admitting: Certified Nurse Midwife

## 2018-06-21 VITALS — BP 142/80 | HR 76 | Resp 14 | Ht 59.75 in | Wt 111.4 lb

## 2018-06-21 DIAGNOSIS — N952 Postmenopausal atrophic vaginitis: Secondary | ICD-10-CM

## 2018-06-21 DIAGNOSIS — Z01419 Encounter for gynecological examination (general) (routine) without abnormal findings: Secondary | ICD-10-CM

## 2018-06-21 DIAGNOSIS — Z8781 Personal history of (healed) traumatic fracture: Secondary | ICD-10-CM | POA: Diagnosis not present

## 2018-06-21 DIAGNOSIS — N951 Menopausal and female climacteric states: Secondary | ICD-10-CM | POA: Diagnosis not present

## 2018-06-21 DIAGNOSIS — E2839 Other primary ovarian failure: Secondary | ICD-10-CM

## 2018-06-21 NOTE — Progress Notes (Signed)
72 y.o. [id number] Married  Caucasian Fe here for annual exam. Menopausal no HRT. Denies vaginal bleeding or vaginal dryness. Had fractured right wrist 09/19/18 from fall. Has healed well now. Some urgency at times, no change, no leaking of urine. Has not found PCP at this time.Sees Dr. Eliezer Mccoy for Celexa/Lipitor management, but looking for new PCP.Marland Kitchen Desires screening lab, but is not sure what she needs. Still caring for mother who is 105. No other health issues today.    Patient's last menstrual period was 08/06/2000.          Sexually active: No.  The current method of family planning is post menopausal status.    Exercising: Yes.    walking Smoker:  no  Health Maintenance: Pap:  06-17-17 negative          06-16-16 negative           06-12-15 negative History of Abnormal Pap: yes MMG:  01-23-18 density c/BIRADS 1 negative  Self Breast exams: yes Colonoscopy:  03-29-13 negative, f/u 5 years, family hx BMD:   2014  TDaP:  2018  Shingles: no Pneumonia: 2015  Hep C and HIV: hep c negative 2017, HIV negative years ago Labs: if needed   reports that she has never smoked. She has never used smokeless tobacco. She reports that she does not drink alcohol or use drugs.  Past Medical History:  Diagnosis Date  . Anxiety   . Broken ankle    crushed heel 2013  . Condyloma   . Depression   . Dysplasia of cervix, low grade (CIN 1) 1995   LEEP  . Fibromyalgia   . H/O: rheumatic fever   . Hepatitis B    with complete resolution- documentation - [facility]    Past Surgical History:  Procedure Laterality Date  . BLADDER SURGERY  1980  . EXPLORATORY LAPAROTOMY     Due to bladder injury with fall of a horse  . HYSTEROSCOPY  2004   D&C Polyp  . LEEP    . TONSILLECTOMY     childhood    Current Outpatient Medications  Medication Sig Dispense Refill  . aspirin 81 MG tablet Take 81 mg by mouth daily.    Marland Kitchen atorvastatin (LIPITOR) 40 MG tablet Take 40 mg by mouth daily.    . Calcium Carbonate-Vitamin D  (CALCIUM + D PO) Take 600 mg by mouth daily.    . citalopram (CELEXA) 20 MG tablet TAKE 1 TABLET BY MOUTH ATBEDTIME. 90 tablet 1  . Multiple Vitamin (MULTIVITAMIN) tablet Take 1 tablet by mouth daily.    . nortriptyline (PAMELOR) 75 MG capsule Take 1 capsule (75 mg total) by mouth at bedtime. 90 capsule 1  . pseudoephedrine-acetaminophen (TYLENOL SINUS) 30-500 MG TABS tablet Take 2 tablets by mouth every 4 (four) hours as needed (sinus pain).     No current facility-administered medications for this visit.     Family History  Problem Relation Age of Onset  . Anxiety disorder Mother   . Cancer Mother        skin  . Thyroid disease Mother   . Heart disease Mother        afib  . Depression Father   . Colon cancer Father   . Hypertension Father   . Depression Sister   . Kidney disease Maternal Grandmother   . Parkinson's disease Maternal Grandfather   . Diabetes Paternal Grandmother   . Heart disease Paternal Grandfather   . Breast cancer Paternal Aunt  ROS:  Pertinent items are noted in HPI.  Otherwise, a comprehensive ROS was negative.  Exam:   LMP 08/06/2000    Ht Readings from Last 3 Encounters:  06/17/17 5' 0.25" (1.53 m)  07/21/16 5' 0.25" (1.53 m)  06/16/16 5' 0.25" (1.53 m)    General appearance: alert, cooperative and appears stated age Head: Normocephalic, without obvious abnormality, atraumatic Neck: no adenopathy, supple, symmetrical, trachea midline and thyroid normal to inspection and palpation Lungs: clear to auscultation bilaterally Breasts: normal appearance, no masses or tenderness, No nipple retraction or dimpling, No nipple discharge or bleeding, No axillary or supraclavicular adenopathy Heart: regular rate and rhythm Abdomen: soft, non-tender; no masses,  no organomegaly Extremities: extremities normal, atraumatic, no cyanosis or edema Skin: Skin color, texture, turgor normal. No rashes or lesions Lymph nodes: Cervical, supraclavicular, and axillary  nodes normal. No abnormal inguinal nodes palpated Neurologic: Grossly normal   Pelvic: External genitalia:  no lesions              Urethra:  normal appearing urethra with no masses, tenderness or lesions              Bartholin's and Skene's: normal                 Vagina: normal appearing vagina with normal color and discharge, no lesions              Cervix: multiparous appearance, no cervical motion tenderness and no lesions              Pap taken: No. Bimanual Exam:  Uterus:  normal size, contour, position, consistency, mobility, non-tender              Adnexa: normal adnexa and no mass, fullness, tenderness               Rectovaginal: Confirms               Anus:  normal sphincter tone, no lesions  Chaperone present: yes  A:  Well Woman with normal exam  Menopausal no HRT  Atrophic vaginitis  Cholesterol/Celexa/Pamelor management with MD.  BMD due, wrist fracture in past year from fall  P:   Reviewed health and wellness pertinent to exam  Aware of need to advise if vaginal bleeding  Discussed finding and etiology. Discussed OTC products of Replens, coconut or Olive oil use. Instructions given for daily use.  Questions addressed.  Continue follow up with PCP as indicated.  Discussed BMD importance and recent fracture. Patient agreeable to be scheduled, will do prior to leaving today. Discussed calcium importance and Vitamin D for absorption. Questions addressed.  Pap smear: no   counseled on breast self exam, mammography screening, feminine hygiene, adequate intake of calcium and vitamin D, diet and exercise. Given referral list for PCP for her to establish in next year.  return annually or prn  An After Visit Summary was printed and given to the patient.

## 2018-06-21 NOTE — Patient Instructions (Signed)

## 2018-06-21 NOTE — Progress Notes (Signed)
Patient scheduled for BMD at [facility] for Wednesday 06-28-18 at 1330. Patient in office when scheduled and agreeable to date and time of appointment. Advised to stop calcium supplement 48 hours prior to appointment. Patient agreeable.

## 2018-06-26 ENCOUNTER — Ambulatory Visit (HOSPITAL_COMMUNITY): Payer: Self-pay | Admitting: Psychiatry

## 2018-06-29 ENCOUNTER — Encounter: Payer: Self-pay | Admitting: Certified Nurse Midwife

## 2018-06-29 ENCOUNTER — Ambulatory Visit (HOSPITAL_COMMUNITY): Payer: Self-pay | Admitting: Psychiatry

## 2018-06-29 DIAGNOSIS — M8589 Other specified disorders of bone density and structure, multiple sites: Secondary | ICD-10-CM | POA: Diagnosis not present

## 2018-07-10 DIAGNOSIS — R69 Illness, unspecified: Secondary | ICD-10-CM | POA: Diagnosis not present

## 2018-07-10 DIAGNOSIS — R0789 Other chest pain: Secondary | ICD-10-CM | POA: Diagnosis not present

## 2018-07-10 DIAGNOSIS — E785 Hyperlipidemia, unspecified: Secondary | ICD-10-CM | POA: Diagnosis not present

## 2018-07-10 DIAGNOSIS — I159 Secondary hypertension, unspecified: Secondary | ICD-10-CM | POA: Diagnosis not present

## 2018-07-10 DIAGNOSIS — M797 Fibromyalgia: Secondary | ICD-10-CM | POA: Diagnosis not present

## 2018-07-11 ENCOUNTER — Telehealth: Payer: Self-pay

## 2018-07-11 NOTE — Telephone Encounter (Signed)
Left message to give bmd results.

## 2018-07-14 NOTE — Telephone Encounter (Signed)
Left message for callback to let patient know that her  2019 bone density shows some osteopenia. See full report.

## 2018-07-19 NOTE — Telephone Encounter (Signed)
Patient is returning a call to Joy. °

## 2018-07-19 NOTE — Telephone Encounter (Signed)
Pt notified that bone density shows osteopenia in the right & left hip & spine. See scanned in results.

## 2018-07-19 NOTE — Telephone Encounter (Signed)
Patient returning call to Joy.  

## 2018-07-24 ENCOUNTER — Other Ambulatory Visit (HOSPITAL_COMMUNITY): Payer: Self-pay | Admitting: Psychiatry

## 2018-07-24 DIAGNOSIS — F330 Major depressive disorder, recurrent, mild: Secondary | ICD-10-CM

## 2018-08-03 ENCOUNTER — Other Ambulatory Visit (HOSPITAL_COMMUNITY): Payer: Self-pay

## 2018-08-03 DIAGNOSIS — F330 Major depressive disorder, recurrent, mild: Secondary | ICD-10-CM

## 2018-08-03 MED ORDER — NORTRIPTYLINE HCL 75 MG PO CAPS: 75.0000 mg | ORAL_CAPSULE | Freq: Every day | ORAL | 0 refills | Status: DC

## 2018-08-28 ENCOUNTER — Ambulatory Visit (HOSPITAL_COMMUNITY): Payer: Self-pay | Admitting: Psychiatry

## 2018-10-01 DIAGNOSIS — L0390 Cellulitis, unspecified: Secondary | ICD-10-CM | POA: Diagnosis not present

## 2018-10-05 ENCOUNTER — Encounter (HOSPITAL_COMMUNITY): Payer: Self-pay | Admitting: Psychiatry

## 2018-10-05 ENCOUNTER — Ambulatory Visit (HOSPITAL_COMMUNITY): Payer: Medicare HMO | Admitting: Psychiatry

## 2018-10-05 DIAGNOSIS — R69 Illness, unspecified: Secondary | ICD-10-CM | POA: Diagnosis not present

## 2018-10-05 DIAGNOSIS — F330 Major depressive disorder, recurrent, mild: Secondary | ICD-10-CM

## 2018-10-05 MED ORDER — NORTRIPTYLINE HCL 75 MG PO CAPS: 75.0000 mg | ORAL_CAPSULE | Freq: Every day | ORAL | 1 refills | Status: DC

## 2018-10-05 MED ORDER — CITALOPRAM HYDROBROMIDE 20 MG PO TABS: ORAL_TABLET | ORAL | 1 refills | Status: DC

## 2018-10-05 NOTE — Progress Notes (Signed)
BH MD/PA/NP OP Progress Note  10/05/2018 10:56 AM Anna Lindsey  MRN:  [id number]  Chief Complaint: Things are same.  I am doing okay.    HPI: Patient came for her follow-up appointment.  She is taking nortriptyline and Celexa which is helping her anxiety and depression.  She continues to take care of her mother who is now 71 years old.  Her mother lives with her.  She admitted mother gets some time demanding and irritable but she is now used to it.  There is no other person who can take care of her mother.  Sometimes husband is supportive.  Patient has a sister but she lives in [place].  She is sleeping better with the medication.  She denies any crying spells or any irritability.  She is pleased that mother can do her ADLs and that helps a lot.  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  She endorsed sometimes gets burnout and overwhelmed and not she is thinking to see a therapist.  She has in the past not interested but now she realized that she needs some outlet.  Her energy level is okay.  Patient has a daughter who sometimes comes and help her.  Patient denies drinking or using any illegal substances.  Her appetite is okay.  Visit Diagnosis:    ICD-10-CM   1. Mild episode of recurrent major depressive disorder (HCC) F33.0 nortriptyline (PAMELOR) 75 MG capsule    citalopram (CELEXA) 20 MG tablet    Past Psychiatric History: Reviewed.  Past Medical History:  Past Medical History:  Diagnosis Date  . Anxiety   . Broken ankle    crushed heel 2013  . Condyloma   . Depression   . Dysplasia of cervix, low grade (CIN 1) 1995   LEEP  . Fibromyalgia   . H/O: rheumatic fever   . Hepatitis B    with complete resolution- documentation - [facility]    Past Surgical History:  Procedure Laterality Date  . BLADDER SURGERY  1980  . EXPLORATORY LAPAROTOMY     Due to bladder injury with fall of a horse  . HYSTEROSCOPY  2004   D&C Polyp  . LEEP    .  TONSILLECTOMY     childhood    Family Psychiatric History: Reviewed  Family History:  Family History  Problem Relation Age of Onset  . Anxiety disorder Mother   . Cancer Mother        skin  . Thyroid disease Mother   . Heart disease Mother        afib  . Depression Father   . Colon cancer Father   . Hypertension Father   . Depression Sister   . Kidney disease Maternal Grandmother   . Parkinson's disease Maternal Grandfather   . Diabetes Paternal Grandmother   . Heart disease Paternal Grandfather   . Breast cancer Paternal Aunt     Social History:  Social History   Socioeconomic History  . Marital status: Married    Spouse name: Not on file  . Number of children: Not on file  . Years of education: Not on file  . Highest education level: Not on file  Occupational History  . Not on file  Social Needs  . Financial resource strain: Not on file  . Food insecurity:    Worry: Not on file    Inability: Not on file  . Transportation needs:    Medical: Not on file  Non-medical: Not on file  Tobacco Use  . Smoking status: Never Smoker  . Smokeless tobacco: Never Used  Substance and Sexual Activity  . Alcohol use: No    Alcohol/week: 0.0 standard drinks  . Drug use: No  . Sexual activity: Not Currently    Partners: Male    Birth control/protection: Post-menopausal  Lifestyle  . Physical activity:    Days per week: Not on file    Minutes per session: Not on file  . Stress: Not on file  Relationships  . Social connections:    Talks on phone: Not on file    Gets together: Not on file    Attends religious service: Not on file    Active member of club or organization: Not on file    Attends meetings of clubs or organizations: Not on file    Relationship status: Not on file  Other Topics Concern  . Not on file  Social History Narrative   Exercise stretching once a day for 5 minutes    Allergies:  Allergies  Allergen Reactions  . Ciprofloxacin Anxiety and  Anaphylaxis    Metabolic Disorder Labs: No results found for: HGBA1C, MPG No results found for: PROLACTIN Lab Results  Component Value Date   CHOL 216 (H) 04/02/2015   TRIG 60 04/02/2015   HDL 58 04/02/2015   CHOLHDL 3.7 04/02/2015   VLDL 12 04/02/2015   LDLCALC 146 (H) 04/02/2015   LDLCALC 131 (H) 10/28/2014   Lab Results  Component Value Date   TSH 0.93 06/16/2016   TSH 1.324 10/28/2014    Therapeutic Level Labs: No results found for: LITHIUM No results found for: VALPROATE No components found for:  CBMZ  Current Medications: Current Outpatient Medications  Medication Sig Dispense Refill  . aspirin 81 MG tablet Take 81 mg by mouth daily.    Marland Kitchen atorvastatin (LIPITOR) 40 MG tablet Take 40 mg by mouth daily.    . Calcium Carbonate-Vitamin D (CALCIUM + D PO) Take 600 mg by mouth daily.    . cephALEXin (KEFLEX) 500 MG capsule Take 500 mg by mouth 4 (four) times daily.    . citalopram (CELEXA) 20 MG tablet TAKE 1 TABLET BY MOUTH ATBEDTIME. 90 tablet 1  . nortriptyline (PAMELOR) 75 MG capsule TAKE ONE CAPSULE BY MOUTH AT BEDTIME  30 capsule 0  . pseudoephedrine-acetaminophen (TYLENOL SINUS) 30-500 MG TABS tablet Take 2 tablets by mouth every 4 (four) hours as needed (sinus pain).    . Multiple Vitamin (MULTIVITAMIN) tablet Take 1 tablet by mouth daily.     No current facility-administered medications for this visit.      Musculoskeletal: Strength & Muscle Tone: within normal limits Gait & Station: normal Patient leans: N/A  Psychiatric Specialty Exam: ROS  Blood pressure 134/76, pulse 82, height 4' 11.75" (1.518 m), weight 108 lb (49 kg), last menstrual period 08/06/2000, SpO2 97 %.Body mass index is 21.27 kg/m.  General Appearance: Casual  Eye Contact:  Good  Speech:  Clear and Coherent  Volume:  Normal  Mood:  Anxious  Affect:  Congruent  Thought Process:  Goal Directed  Orientation:  Full (Time, Place, and Person)  Thought Content: Logical   Suicidal  Thoughts:  No  Homicidal Thoughts:  No  Memory:  Immediate;   Good Recent;   Good Remote;   Good  Judgement:  Good  Insight:  Good  Psychomotor Activity:  Normal  Concentration:  Concentration: Good and Attention Span: Good  Recall:  Good  Fund of Knowledge: Good  Language: Good  Akathisia:  No  Handed:  Right  AIMS (if indicated): not done  Assets:  Communication Skills Desire for Improvement Housing Social Support  ADL's:  Intact  Cognition: WNL  Sleep:  Good   Screenings: PHQ2-9     Office Visit from 07/21/2016 in Primary Care at [facility] Visit from 05/15/2015 in Primary Care at [facility] Visit from 10/28/2014 in Primary Care at [facility] Total Score  0  0  1  PHQ-9 Total Score  -  -  2       Assessment and Plan: Major depressive disorder, recurrent.  Generalized anxiety disorder.  Discussed patient situation which is chronic and she has no choice other than taking care of her 37 years old mother.  She wants to continue nortriptyline and Celexa which is helping her anxiety and depression.  I would recommend to see a therapist for coping skills as patient now agreed to see a therapist.  Recommended to call us back if she has any question or any concern.  Follow-up in 6 months.   Cleotis Nipper, MD 10/05/2018, 10:56 AM

## 2018-10-19 ENCOUNTER — Other Ambulatory Visit: Payer: Self-pay | Admitting: Cardiology

## 2018-10-19 DIAGNOSIS — M797 Fibromyalgia: Secondary | ICD-10-CM | POA: Diagnosis not present

## 2018-10-19 DIAGNOSIS — E785 Hyperlipidemia, unspecified: Secondary | ICD-10-CM | POA: Diagnosis not present

## 2018-10-19 DIAGNOSIS — R69 Illness, unspecified: Secondary | ICD-10-CM | POA: Diagnosis not present

## 2018-10-19 DIAGNOSIS — R079 Chest pain, unspecified: Secondary | ICD-10-CM

## 2018-10-19 DIAGNOSIS — I209 Angina pectoris, unspecified: Secondary | ICD-10-CM | POA: Diagnosis not present

## 2018-10-30 ENCOUNTER — Ambulatory Visit (HOSPITAL_COMMUNITY)
Admission: RE | Admit: 2018-10-30 | Discharge: 2018-10-30 | Disposition: A | Discharge status: Home / Self Care | Payer: Medicare HMO | Source: Ambulatory Visit | Attending: Cardiology | Admitting: Cardiology

## 2018-10-30 DIAGNOSIS — R079 Chest pain, unspecified: Secondary | ICD-10-CM | POA: Diagnosis not present

## 2018-10-30 DIAGNOSIS — I209 Angina pectoris, unspecified: Secondary | ICD-10-CM | POA: Diagnosis not present

## 2018-10-30 DIAGNOSIS — E785 Hyperlipidemia, unspecified: Secondary | ICD-10-CM | POA: Diagnosis not present

## 2018-10-30 DIAGNOSIS — R69 Illness, unspecified: Secondary | ICD-10-CM | POA: Diagnosis not present

## 2018-10-30 DIAGNOSIS — R9431 Abnormal electrocardiogram [ECG] [EKG]: Secondary | ICD-10-CM | POA: Diagnosis not present

## 2018-10-30 MED ORDER — TECHNETIUM TC 99M TETROFOSMIN IV KIT
30.0000 | PACK | Freq: Once | INTRAVENOUS | Status: AC | PRN
  Administered 2018-10-30: 30 via INTRAVENOUS

## 2018-10-30 MED ORDER — REGADENOSON 0.4 MG/5ML IV SOLN
0.4000 mg | Freq: Once | INTRAVENOUS | Status: AC
  Administered 2018-10-30: 0.4 mg via INTRAVENOUS

## 2018-10-30 MED ORDER — TECHNETIUM TC 99M TETROFOSMIN IV KIT
10.0000 | PACK | Freq: Once | INTRAVENOUS | Status: AC | PRN
  Administered 2018-10-30: 10 via INTRAVENOUS

## 2018-10-30 MED ORDER — REGADENOSON 0.4 MG/5ML IV SOLN
INTRAVENOUS | Status: AC
  Filled 2018-10-30: qty 5

## 2019-01-18 DIAGNOSIS — M797 Fibromyalgia: Secondary | ICD-10-CM | POA: Diagnosis not present

## 2019-01-18 DIAGNOSIS — E785 Hyperlipidemia, unspecified: Secondary | ICD-10-CM | POA: Diagnosis not present

## 2019-01-18 DIAGNOSIS — R69 Illness, unspecified: Secondary | ICD-10-CM | POA: Diagnosis not present

## 2019-02-09 DIAGNOSIS — E785 Hyperlipidemia, unspecified: Secondary | ICD-10-CM | POA: Diagnosis not present

## 2019-02-09 DIAGNOSIS — I10 Essential (primary) hypertension: Secondary | ICD-10-CM | POA: Diagnosis not present

## 2019-03-20 ENCOUNTER — Telehealth: Payer: Self-pay | Admitting: Certified Nurse Midwife

## 2019-03-20 ENCOUNTER — Other Ambulatory Visit: Payer: Self-pay

## 2019-03-20 ENCOUNTER — Encounter: Payer: Self-pay | Admitting: Family Medicine

## 2019-03-20 ENCOUNTER — Ambulatory Visit (INDEPENDENT_AMBULATORY_CARE_PROVIDER_SITE_OTHER): Payer: Medicare HMO | Admitting: Family Medicine

## 2019-03-20 DIAGNOSIS — S70361A Insect bite (nonvenomous), right thigh, initial encounter: Secondary | ICD-10-CM | POA: Diagnosis not present

## 2019-03-20 DIAGNOSIS — W57XXXA Bitten or stung by nonvenomous insect and other nonvenomous arthropods, initial encounter: Secondary | ICD-10-CM | POA: Diagnosis not present

## 2019-03-20 DIAGNOSIS — L03115 Cellulitis of right lower limb: Secondary | ICD-10-CM | POA: Diagnosis not present

## 2019-03-20 MED ORDER — DOXYCYCLINE HYCLATE 100 MG PO TABS: 100.0000 mg | ORAL_TABLET | Freq: Two times a day (BID) | ORAL | 0 refills | Status: DC

## 2019-03-20 NOTE — Telephone Encounter (Signed)
I have a patient on the line that had a tick bite 2 1/2 -3 days ago. Has a rash around it that appears to be a bulls eye.. Reports area is warm to the touch. Denies fever, chills, fatigue, muscle aches. Reports it is about 1 inch in diameter. Bite is right above knee. Patient does not have a PCP at this time. Spoke with Dr.Wallace's office who will see the patent via telemedicine at this time. Patient is aware of how to follow prompts for telemedicine.  Routing to provider and will close encounter.

## 2019-03-20 NOTE — Telephone Encounter (Signed)
Patient removed tick two and a half days ago and now the area has a rash around it. Does not have PCP and would like nurse's opinion on what to do. No other symptoms.

## 2019-03-20 NOTE — Progress Notes (Addendum)
Virtual Visit via Video   I connected with Anna Lindsey by a video enabled telemedicine application and verified that I am speaking with the correct person using two identifiers. Location patient: Home Location provider: [facility], Office Persons participating in the virtual visit: Uliana L Rondinelli, Helane Rima, DO Reviewed all precautions and expectations with prevention of Covid-19  I discussed the limitations of evaluation and management by telemedicine and the availability of in person appointments. The patient expressed understanding and agreed to proceed.  Subjective:   HPI: Patient had tick bite on right knee. Bite was viewed by Dr. Earlene Plater. Patient states that the tic was very small and dark in color. She has been working outside working. She does have her mom living with her that is under hospice care. She is 106.   Tick bite was noticed about 3 days ago.  Estimated that it was on her body for about 2 days.  Bull's-eye rash per patient.  Warm to the touch.  No fever, chills, muscle aches.  Better each in diameter.  Above knee.  ROS: See pertinent positives and negatives per HPI.  Patient Active Problem List   Diagnosis Date Noted  . Decreased grip strength 12/19/2017  . Decreased range of motion of right wrist 09/19/2017  . Pain and swelling of right wrist 09/19/2017  . Depressive disorder, not elsewhere classified 01/16/2013    Social History   Tobacco Use  . Smoking status: Never Smoker  . Smokeless tobacco: Never Used  Substance Use Topics  . Alcohol use: No    Alcohol/week: 0.0 standard drinks   Current Outpatient Medications:  .  aspirin 81 MG tablet, Take 81 mg by mouth daily., Disp: , Rfl:  .  atorvastatin (LIPITOR) 40 MG tablet, Take 40 mg by mouth daily., Disp: , Rfl:  .  Calcium Carbonate-Vitamin D (CALCIUM + D PO), Take 600 mg by mouth daily., Disp: , Rfl:  .  citalopram (CELEXA) 20 MG tablet, TAKE 1 TABLET BY MOUTH ATBEDTIME., Disp: 90 tablet, Rfl:  1 .  Multiple Vitamin (MULTIVITAMIN) tablet, Take 1 tablet by mouth daily., Disp: , Rfl:  .  nortriptyline (PAMELOR) 75 MG capsule, Take 1 capsule (75 mg total) by mouth at bedtime., Disp: 90 capsule, Rfl: 1  Allergies  Allergen Reactions  . Ciprofloxacin Anxiety and Anaphylaxis   Objective:   VITALS: Per patient if applicable, see vitals. GENERAL: Alert, appears well and in no acute distress. HEENT: Atraumatic, conjunctiva clear, no obvious abnormalities on inspection of external nose and ears. NECK: Normal movements of the head and neck. CARDIOPULMONARY: No increased WOB. Speaking in clear sentences. I:E ratio WNL.  MS: Moves all visible extremities without noticeable abnormality. PSYCH: Pleasant and cooperative, well-groomed. Speech normal rate and rhythm. Affect is appropriate. Insight and judgement are appropriate. Attention is focused, linear, and appropriate.  NEURO: CN grossly intact. Oriented as arrived to appointment on time with no prompting. Moves both UE equally.  SKIN: Knee, 2 to 4 cm area of erythema and induration.  No streaking, drainage, or open lesions.  Assessment and Plan:   Lenox was seen today for establish care.  Diagnoses and all orders for this visit:  Tick bite of right thigh, initial encounter  Cellulitis of right lower extremity -     doxycycline (VIBRA-TABS) 100 MG tablet; Take 1 tablet (100 mg total) by mouth 2 (two) times daily.   . Reviewed expectations re: course of current medical issues. . Discussed self-management of symptoms. . Outlined  signs and symptoms indicating need for more acute intervention. . Patient verbalized understanding and all questions were answered. Marland Kitchen. Health Maintenance issues including appropriate healthy diet, exercise, and smoking avoidance were discussed with patient. . See orders for this visit as documented in the electronic medical record.  Helane RimaErica Daishawn Lauf, DO  Records requested if needed. Time spent: 25 minutes, of  which >50% was spent in obtaining information about her symptoms, reviewing her previous labs, evaluations, and treatments, counseling her about her condition (please see the discussed topics above), and developing a plan to further investigate it; she had a number of questions which I addressed.

## 2019-03-25 ENCOUNTER — Encounter: Payer: Self-pay | Admitting: Family Medicine

## 2019-04-09 ENCOUNTER — Ambulatory Visit (INDEPENDENT_AMBULATORY_CARE_PROVIDER_SITE_OTHER): Payer: Medicare HMO | Admitting: Psychiatry

## 2019-04-09 ENCOUNTER — Encounter (HOSPITAL_COMMUNITY): Payer: Self-pay | Admitting: Psychiatry

## 2019-04-09 ENCOUNTER — Other Ambulatory Visit: Payer: Self-pay

## 2019-04-09 DIAGNOSIS — F330 Major depressive disorder, recurrent, mild: Secondary | ICD-10-CM

## 2019-04-09 DIAGNOSIS — F419 Anxiety disorder, unspecified: Secondary | ICD-10-CM | POA: Diagnosis not present

## 2019-04-09 DIAGNOSIS — R69 Illness, unspecified: Secondary | ICD-10-CM | POA: Diagnosis not present

## 2019-04-09 MED ORDER — CITALOPRAM HYDROBROMIDE 20 MG PO TABS: ORAL_TABLET | ORAL | 0 refills | Status: DC

## 2019-04-09 MED ORDER — NORTRIPTYLINE HCL 75 MG PO CAPS: 75.0000 mg | ORAL_CAPSULE | Freq: Every day | ORAL | 0 refills | Status: DC

## 2019-04-09 NOTE — Progress Notes (Signed)
Virtual Visit via Telephone Note  I connected with Anna Lindsey on 04/09/19 at  2:00 PM EDT by telephone and verified that I am speaking with the correct person using two identifiers.   I discussed the limitations, risks, security and privacy concerns of performing an evaluation and management service by telephone and the availability of in person appointments. I also discussed with the patient that there may be a patient responsible charge related to this service. The patient expressed understanding and agreed to proceed.   History of Present Illness: Patient was evaluated through phone session.  She admitted increase anxiety due to taking care of her 32 year old mother who is having dementia.  Patient told she used to get hospice care but due to COVID-19 they are not coming in person and therapy is done over phone.  She is also not getting any extra help for her mother's ADLs and recently she has to hired a private person so her mother can get at least bath twice a week.  Her only help is her daughter and sometime her husband.  She admitted sometimes overwhelmed but denies any agitation, irritability, crying spells or any feeling of hopelessness or suicidal thoughts.  Her appetite is okay.  Her energy level is fair.  She is sleeping better with nortriptyline.  She reported no side effects.  She is tolerating her medication and denies any drinking or using any illegal substances.   Observations/Objective: Mental status examination done on the phone.  Patient described her mood sad and anxious.  Her speech is slow but clear and coherent.  Her attention concentration is okay.  There were no flight of ideas or loose association.  She denies any auditory or visual hallucination.  She denies any active or passive suicidal thoughts or homicidal thought.  There were no delusions, paranoia.  She is alert and oriented x3.  Her cognition is good.  Her fund of knowledge is adequate.  Her insight and judgment is  okay.  Assessment and Plan: Major depressive disorder, recurrent.  Generalized anxiety disorder.  I discussed that she should talk to the hospice care to increase the sessions on the phone as they cannot come in person and patient feel that sessions are not too long and I recommend that she should talk to a therapist to have a longer session if available.  I also offer therapy from our office but patient does not want to get extra spends.  She also does not want to change the medication or increase the dose however she agreed to follow-up in 3 months that if symptoms do not improve then she may consider increasing the dose.  Discussed medication side effects and benefits.  Recommended to call us back if she has any question or any concern.  Follow-up in 3 months.    Follow Up Instructions:    I discussed the assessment and treatment plan with the patient. The patient was provided an opportunity to ask questions and all were answered. The patient agreed with the plan and demonstrated an understanding of the instructions.   The patient was advised to call back or seek an in-person evaluation if the symptoms worsen or if the condition fails to improve as anticipated.  I provided 15 minutes of non-face-to-face time during this encounter.   Cleotis Nipper, MD

## 2019-04-19 DIAGNOSIS — M797 Fibromyalgia: Secondary | ICD-10-CM | POA: Diagnosis not present

## 2019-04-19 DIAGNOSIS — E785 Hyperlipidemia, unspecified: Secondary | ICD-10-CM | POA: Diagnosis not present

## 2019-04-19 DIAGNOSIS — R69 Illness, unspecified: Secondary | ICD-10-CM | POA: Diagnosis not present

## 2019-05-19 DIAGNOSIS — Z7982 Long term (current) use of aspirin: Secondary | ICD-10-CM | POA: Diagnosis not present

## 2019-05-19 DIAGNOSIS — G8929 Other chronic pain: Secondary | ICD-10-CM | POA: Diagnosis not present

## 2019-05-19 DIAGNOSIS — Z8249 Family history of ischemic heart disease and other diseases of the circulatory system: Secondary | ICD-10-CM | POA: Diagnosis not present

## 2019-05-19 DIAGNOSIS — Z833 Family history of diabetes mellitus: Secondary | ICD-10-CM | POA: Diagnosis not present

## 2019-05-19 DIAGNOSIS — M1990 Unspecified osteoarthritis, unspecified site: Secondary | ICD-10-CM | POA: Diagnosis not present

## 2019-05-19 DIAGNOSIS — M797 Fibromyalgia: Secondary | ICD-10-CM | POA: Diagnosis not present

## 2019-05-19 DIAGNOSIS — R69 Illness, unspecified: Secondary | ICD-10-CM | POA: Diagnosis not present

## 2019-05-19 DIAGNOSIS — E785 Hyperlipidemia, unspecified: Secondary | ICD-10-CM | POA: Diagnosis not present

## 2019-05-30 DIAGNOSIS — R69 Illness, unspecified: Secondary | ICD-10-CM | POA: Diagnosis not present

## 2019-06-18 NOTE — Progress Notes (Signed)
Anna Lindsey is a 73 y.o. female here for an acute visit.  History of Present Illness:   .Insurance claims handlerAmber Lindsey, scribe for Dr. Earlene Lindsey.  HPI: Patient had fall on Sunday, hitting right side of ribs on counter. She has had pain and trouble taking deep breath. She does have some tenderness to this area. No head trauma. It was a trip and fall.   PMHx, SurgHx, SocialHx, Medications, and Allergies were reviewed in the Visit Navigator and updated as appropriate.  Current Medications    .  aspirin 81 MG tablet, Take 81 mg by mouth daily., Disp: , Rfl:  .  atorvastatin (LIPITOR) 40 MG tablet, Take 40 mg by mouth daily., Disp: , Rfl:  .  Calcium Carbonate-Vitamin D (CALCIUM + D PO), Take 600 mg by mouth daily., Disp: , Rfl:  .  citalopram (CELEXA) 20 MG tablet, TAKE 1 TABLET BY MOUTH ATBEDTIME., Disp: 90 tablet, Rfl: 0 .  Multiple Vitamin (MULTIVITAMIN) tablet, Take 1 tablet by mouth daily., Disp: , Rfl:  .  nortriptyline (PAMELOR) 75 MG capsule, Take 1 capsule (75 mg total) by mouth at bedtime., Disp: 90 capsule, Rfl: 0  Allergies  Allergen Reactions  . Ciprofloxacin Anxiety and Anaphylaxis   Review of Systems   Pertinent items are noted in the HPI. Otherwise, ROS is negative.  Vitals   Vitals:   06/19/19 1454  BP: 140/80  Pulse: 90  Temp: 98.6 F (37 C)  TempSrc: Oral  SpO2: 95%  Weight: 114 lb 12.8 oz (52.1 kg)  Height: 5' 1.5" (1.562 m)     Body mass index is 21.34 kg/m.  Physical Exam   Physical Exam Vitals signs and nursing note reviewed.  HENT:     Head: Normocephalic and atraumatic.  Eyes:     Pupils: Pupils are equal, round, and reactive to light.  Neck:     Musculoskeletal: Normal range of motion and neck supple.  Cardiovascular:     Rate and Rhythm: Normal rate and regular rhythm.     Heart sounds: Normal heart sounds.  Pulmonary:     Effort: Pulmonary effort is normal.  Chest:     Chest wall: Tenderness present.    Abdominal:     Palpations: Abdomen is  soft.  Skin:    General: Skin is warm.  Psychiatric:        Behavior: Behavior normal.    Dg Ribs Unilateral W/chest Right  Result Date: 06/20/2019 CLINICAL DATA:  Anna Lindsey.  Right rib pain. EXAM: RIGHT RIBS AND CHEST - 3+ VIEW COMPARISON:  None. FINDINGS: The cardiac silhouette, mediastinal and hilar contours are within normal limits. There is a small right pleural effusion but no definite pneumothorax. No infiltrates are identified. No worrisome pulmonary lesions. Biapical pleural and parenchymal scarring changes. Two dedicated views of the right ribs demonstrate right sixth, seventh, eighth and ninth rib fractures. No significant displacement. IMPRESSION: 1. Right sixth, seventh, eighth and ninth rib fractures. 2. Small right pleural effusion but no pneumothorax or pulmonary contusion. Electronically Signed   By: Anna Lindsey.  Anna M.D.   On: 06/20/2019 09:20   Assessment and Plan   Anna Lindsey was seen today for chest pain.  Diagnoses and all orders for this visit:  Rib pain on right side -     DG Ribs Unilateral W/Chest Right  Fall, mechanical  Closed fracture of multiple ribs of right side, initial encounter Comments: 4 identified fractures. Discussed pain control, precuations, red flags, and pulmonary toilet. -  HYDROcodone-acetaminophen (NORCO/VICODIN) 5-325 MG tablet; Take 1 tablet by mouth every 6 (six) hours as needed for moderate pain.  Mild episode of recurrent major depressive disorder [place]), followed by Psychiatry Comments: Doing well.  Screening for lipid disorders -     Lipid panel  Vitamin D deficiency -     VITAMIN D 25 Hydroxy (Vit-D Deficiency, Fractures)  Shortness of breath -     CBC with Differential/Platelet -     Comprehensive metabolic panel  . Reviewed expectations re: course of current medical issues. . Discussed self-management of symptoms. . Outlined signs and symptoms indicating need for more acute intervention. . Patient verbalized understanding and all  questions were answered. Marland Kitchen Health Maintenance issues including appropriate healthy diet, exercise, and smoking avoidance were discussed with patient. . See orders for this visit as documented in the electronic medical record. . Patient received an After Visit Summary.  CMA served as Education administrator during this visit. History, Physical, and Plan performed by medical provider. The above documentation has been reviewed and is accurate and complete. Anna Lindsey, D.O.  Anna Deutscher, DO [place], [facility] 06/21/2019

## 2019-06-19 ENCOUNTER — Ambulatory Visit (INDEPENDENT_AMBULATORY_CARE_PROVIDER_SITE_OTHER): Payer: Medicare HMO | Admitting: Family Medicine

## 2019-06-19 ENCOUNTER — Telehealth: Payer: Self-pay | Admitting: Family Medicine

## 2019-06-19 ENCOUNTER — Ambulatory Visit (INDEPENDENT_AMBULATORY_CARE_PROVIDER_SITE_OTHER): Payer: Medicare HMO

## 2019-06-19 ENCOUNTER — Other Ambulatory Visit: Payer: Self-pay

## 2019-06-19 VITALS — BP 140/80 | HR 90 | Temp 98.6°F | Ht 61.5 in | Wt 114.8 lb

## 2019-06-19 DIAGNOSIS — F330 Major depressive disorder, recurrent, mild: Secondary | ICD-10-CM

## 2019-06-19 DIAGNOSIS — W19XXXA Unspecified fall, initial encounter: Secondary | ICD-10-CM

## 2019-06-19 DIAGNOSIS — R0602 Shortness of breath: Secondary | ICD-10-CM

## 2019-06-19 DIAGNOSIS — R0781 Pleurodynia: Secondary | ICD-10-CM | POA: Diagnosis not present

## 2019-06-19 DIAGNOSIS — Z13220 Encounter for screening for lipoid disorders: Secondary | ICD-10-CM

## 2019-06-19 DIAGNOSIS — R69 Illness, unspecified: Secondary | ICD-10-CM | POA: Diagnosis not present

## 2019-06-19 DIAGNOSIS — E559 Vitamin D deficiency, unspecified: Secondary | ICD-10-CM

## 2019-06-19 DIAGNOSIS — S2241XA Multiple fractures of ribs, right side, initial encounter for closed fracture: Secondary | ICD-10-CM

## 2019-06-19 DIAGNOSIS — J90 Pleural effusion, not elsewhere classified: Secondary | ICD-10-CM | POA: Diagnosis not present

## 2019-06-19 MED ORDER — HYDROCODONE-ACETAMINOPHEN 5-325 MG PO TABS: 1.0000 | ORAL_TABLET | Freq: Four times a day (QID) | ORAL | 0 refills | Status: DC | PRN

## 2019-06-19 NOTE — Telephone Encounter (Signed)
Copied from [place] [id number]. Topic: Quick Communication - Rx Refill/Question >> Jun 19, 2019  4:18 PM Anna Lindsey, Marland Kitchen wrote: Medication: HYDROcodone-acetaminophen (NORCO/VICODIN) 5-325 MG tablet  Has the patient contacted their pharmacy? Yes.   (Agent: If no, request that the patient contact the pharmacy for the refill.) (Agent: If yes, when and what did the pharmacy advise?)  Preferred Pharmacy (with phone number or street [facility]): [facility]  [facility], and was told that sine it is a controlled substance, that it can only be e-scribed. Please call pt about this   Agent: Please be advised that RX refills may take up to 3 business days. We ask that you follow-up with your pharmacy.

## 2019-06-19 NOTE — Telephone Encounter (Signed)
Please see message and advise.  Thank you. ° °

## 2019-06-19 NOTE — Telephone Encounter (Signed)
Please see below.

## 2019-06-20 LAB — CBC WITH DIFFERENTIAL/PLATELET
Basophils Absolute: 0 10*3/uL (ref 0.0–0.1)
Basophils Relative: 0.5 % (ref 0.0–3.0)
Eosinophils Absolute: 0.3 10*3/uL (ref 0.0–0.7)
Eosinophils Relative: 4.2 % (ref 0.0–5.0)
HCT: 36.2 % (ref 36.0–46.0)
Hemoglobin: 12.1 g/dL (ref 12.0–15.0)
Lymphocytes Relative: 22.3 % (ref 12.0–46.0)
Lymphs Abs: 1.6 10*3/uL (ref 0.7–4.0)
MCHC: 33.6 g/dL (ref 30.0–36.0)
MCV: 92.7 fl (ref 78.0–100.0)
Monocytes Absolute: 0.5 10*3/uL (ref 0.1–1.0)
Monocytes Relative: 7.1 % (ref 3.0–12.0)
Neutro Abs: 4.6 10*3/uL (ref 1.4–7.7)
Neutrophils Relative %: 65.9 % (ref 43.0–77.0)
Platelets: 325 10*3/uL (ref 150.0–400.0)
RBC: 3.9 Mil/uL (ref 3.87–5.11)
RDW: 13.2 % (ref 11.5–15.5)
WBC: 7 10*3/uL (ref 4.0–10.5)

## 2019-06-20 LAB — LIPID PANEL
Cholesterol: 124 mg/dL (ref 0–200)
HDL: 53.7 mg/dL (ref >39.00)
LDL Cholesterol: 54 mg/dL (ref 0–99)
NonHDL: 70.57
Total CHOL/HDL Ratio: 2
Triglycerides: 85 mg/dL (ref 0.0–149.0)
VLDL: 17 mg/dL (ref 0.0–40.0)

## 2019-06-20 LAB — COMPREHENSIVE METABOLIC PANEL
ALT: 19 U/L (ref 0–35)
AST: 26 U/L (ref 0–37)
Albumin: 4.3 g/dL (ref 3.5–5.2)
Alkaline Phosphatase: 62 U/L (ref 39–117)
BUN: 6 mg/dL (ref 6–23)
CO2: 28 mEq/L (ref 19–32)
Calcium: 9.1 mg/dL (ref 8.4–10.5)
Chloride: 94 mEq/L — ABNORMAL LOW (ref 96–112)
Creatinine, Ser: 0.55 mg/dL (ref 0.40–1.20)
GFR: 108.58 mL/min (ref >60.00)
Glucose, Bld: 83 mg/dL (ref 70–99)
Potassium: 4.6 mEq/L (ref 3.5–5.1)
Sodium: 131 mEq/L — ABNORMAL LOW (ref 135–145)
Total Bilirubin: 0.9 mg/dL (ref 0.2–1.2)
Total Protein: 6.8 g/dL (ref 6.0–8.3)

## 2019-06-20 LAB — VITAMIN D 25 HYDROXY (VIT D DEFICIENCY, FRACTURES): VITD: 38.82 ng/mL (ref 30.00–100.00)

## 2019-06-20 MED ORDER — HYDROCODONE-ACETAMINOPHEN 5-325 MG PO TABS: 1.0000 | ORAL_TABLET | Freq: Four times a day (QID) | ORAL | 0 refills | Status: DC | PRN

## 2019-06-20 NOTE — Telephone Encounter (Signed)
[facility] stating Rx needs to be e-scribed.  Please advise.

## 2019-06-20 NOTE — Telephone Encounter (Signed)
Caller name: Santiago Glad from [facility]  Relation to pt: Call back number: [phone]  Pharmacy: [facility] # [address] [place] [phone] (Phone) [phone] (Fax)     Reason for call:   As per pharmacist hard copy is not accepted requesting e-scribe: HYDROcodone-acetaminophen (NORCO/VICODIN) 5-325 MG tablet

## 2019-06-20 NOTE — Telephone Encounter (Signed)
It was printed and given to patient, I think. If she did not get the Rx from [place], let me know and I will escribe.

## 2019-06-21 ENCOUNTER — Encounter: Payer: Self-pay | Admitting: Family Medicine

## 2019-06-21 ENCOUNTER — Other Ambulatory Visit: Payer: Self-pay

## 2019-06-21 ENCOUNTER — Telehealth: Payer: Self-pay

## 2019-06-21 NOTE — Telephone Encounter (Signed)
Notified patient of lab results.  Patient verbalized understanding.  

## 2019-06-21 NOTE — Telephone Encounter (Signed)
Copied from [place]. Topic: General - Inquiry >> Jun 21, 2019  9:01 AM Richardo Priest, NT wrote: Reason for CRM: Patient calling in to discuss lab results. Please advise.

## 2019-06-25 ENCOUNTER — Telehealth: Payer: Self-pay | Admitting: Family Medicine

## 2019-06-25 NOTE — Telephone Encounter (Signed)
Please advise on another medication alternative.

## 2019-06-25 NOTE — Telephone Encounter (Signed)
Pt was prescribed HYDROcodone-acetaminophen (NORCO/VICODIN) 5-325 MG tablet For the pain from her cracked ribs but the medication has been causing the Pt to be nauseous and she would like to know if there is something that can be sent in for her that wont cause nausea/ please advise

## 2019-06-25 NOTE — Telephone Encounter (Signed)
Please see message and advise 

## 2019-06-26 ENCOUNTER — Ambulatory Visit: Payer: Medicare HMO | Admitting: Certified Nurse Midwife

## 2019-06-26 MED ORDER — TRAMADOL HCL 50 MG PO TABS: 50.0000 mg | ORAL_TABLET | Freq: Three times a day (TID) | ORAL | 0 refills | Status: AC | PRN

## 2019-06-26 NOTE — Telephone Encounter (Signed)
I spoke with patient and she is informed that med was sent to pharmacy.

## 2019-06-26 NOTE — Telephone Encounter (Signed)
Tramadol sent to pharmacy

## 2019-06-27 ENCOUNTER — Other Ambulatory Visit: Payer: Self-pay | Admitting: Family Medicine

## 2019-06-27 ENCOUNTER — Telehealth: Payer: Self-pay | Admitting: Family Medicine

## 2019-06-27 ENCOUNTER — Ambulatory Visit: Payer: Self-pay

## 2019-06-27 MED ORDER — ONDANSETRON HCL 4 MG PO TABS: 4.0000 mg | ORAL_TABLET | Freq: Three times a day (TID) | ORAL | 0 refills | Status: DC | PRN

## 2019-06-27 NOTE — Telephone Encounter (Signed)
I spoke with patient and she informed me that she is still have pain,  It has went from a sharpe pain to a dull constant pain.  Patient explained that she is still nauseous and have not be able to eat or really drink anything.  Patient said that she is not throwing up blood but had some speck of it in her vomit.  Patient would like something for the nausea.  Please advise.

## 2019-06-27 NOTE — Telephone Encounter (Signed)
Pt called stating that she has been vomiting with blood tinged bile.  She states that it looks pink. She states that she has been in contact with the doctor about her issues. She has Fx ribs which she is taking pain medication.   She started the new medication but the vomiting continues. She states that her breathing has improved. She is vomiting about 10-12 times over that past 24 hours.  3 times today.  She states that today she has not noted blood. She denies dehydration but is slightly dizzy she thinks maybe from the pain medication.  She is voiding regularly. Care instructions read to patient. Patient verbalized understanding. Call placed to office. Pt is aware that office will call her today.   Reason for Disposition . Vomiting a prescription medication  Answer Assessment - Initial Assessment Questions 1. APPEARANCE of BLOOD: "What does the blood look like?" (e.g., color, coffee-grounds)     pink 2. AMOUNT: "How much blood was lost?"     Small amounts 3. VOMITING BLOOD: "How many times did it happen?" or "How many times in the past 24 hours?"     10-12 traces of pink with bile 4. VOMITING WITHOUT BLOOD: "How many times in the past 24 hours?"      Yes this AM 3 times 5. ONSET: "When did vomiting of blood begin?"     yesterday 6. CAUSE: "What do you think is causing the vomiting of blood?"     Pain medication 7. BLOOD THINNERS: "Do you take any blood thinners?" (e.g., Coumadin/warfarin, Pradaxa/dabigatran, aspirin)     none 8. DEHYDRATION: "Are there any signs of dehydration?" "When was the last time you urinated?" "Do you feel dizzy?"     A little dizzy, urinating normal 9. ABDOMINAL PAIN: "Are you having any abdominal pain?" If yes: "What does it feel like? " (e.g., crampy, dull, intermittent, constant)     None just where ribs are fx 10. DIARRHEA: "Is there any diarrhea?" If so, ask: "How many times today?"       no 11. OTHER SYMPTOMS: "Do you have any other symptoms?" (e.g., fever,  blood in stool)     No 12. PREGNANCY: "Is there any chance you are pregnant?" "When was your last menstrual period?"      N/A  Protocols used: VOMITING BLOOD-A-AH

## 2019-06-27 NOTE — Telephone Encounter (Signed)
Please see message and advise.  Thank you. ° °

## 2019-06-27 NOTE — Telephone Encounter (Signed)
I will call in Zofran. If continuing to vomit or any other red flags, go to ED. Otherwise, I want to see her tomorrow morning.

## 2019-06-27 NOTE — Telephone Encounter (Signed)
Please call pt to advise  

## 2019-06-27 NOTE — Telephone Encounter (Signed)
I spoke with patient and informed her of Dr. Alcario Drought response.  Patient will come in office tomorrow.  Karsten Fells will place her on schedule for 10am.

## 2019-06-27 NOTE — Telephone Encounter (Signed)
Per [facility]  Patient states that she has been treated for fractured ribs for the last week and her pain medication was making her sick. Today she was switched to tramadol. Today she is also throwing up blood.

## 2019-06-28 ENCOUNTER — Telehealth: Payer: Self-pay

## 2019-06-28 ENCOUNTER — Ambulatory Visit (INDEPENDENT_AMBULATORY_CARE_PROVIDER_SITE_OTHER): Payer: Medicare HMO

## 2019-06-28 ENCOUNTER — Other Ambulatory Visit: Payer: Self-pay

## 2019-06-28 ENCOUNTER — Encounter: Payer: Self-pay | Admitting: Family Medicine

## 2019-06-28 ENCOUNTER — Ambulatory Visit (INDEPENDENT_AMBULATORY_CARE_PROVIDER_SITE_OTHER): Payer: Medicare HMO | Admitting: Family Medicine

## 2019-06-28 VITALS — BP 145/62 | HR 84 | Temp 98.6°F | Wt 108.0 lb

## 2019-06-28 DIAGNOSIS — T50905A Adverse effect of unspecified drugs, medicaments and biological substances, initial encounter: Secondary | ICD-10-CM | POA: Diagnosis not present

## 2019-06-28 DIAGNOSIS — R110 Nausea: Secondary | ICD-10-CM | POA: Diagnosis not present

## 2019-06-28 DIAGNOSIS — K5903 Drug induced constipation: Secondary | ICD-10-CM | POA: Diagnosis not present

## 2019-06-28 DIAGNOSIS — R0781 Pleurodynia: Secondary | ICD-10-CM | POA: Diagnosis not present

## 2019-06-28 DIAGNOSIS — S2241XA Multiple fractures of ribs, right side, initial encounter for closed fracture: Secondary | ICD-10-CM | POA: Diagnosis not present

## 2019-06-28 DIAGNOSIS — S2241XD Multiple fractures of ribs, right side, subsequent encounter for fracture with routine healing: Secondary | ICD-10-CM

## 2019-06-28 MED ORDER — KETOROLAC TROMETHAMINE 60 MG/2ML IM SOLN
60.0000 mg | Freq: Once | INTRAMUSCULAR | Status: AC
  Administered 2019-06-28: 60 mg via INTRAMUSCULAR

## 2019-06-28 MED ORDER — ESOMEPRAZOLE MAGNESIUM 40 MG PO CPDR: 40.0000 mg | DELAYED_RELEASE_CAPSULE | Freq: Every day | ORAL | 3 refills | Status: DC

## 2019-06-28 MED ORDER — DOCUSATE SODIUM 100 MG PO CAPS: 100.0000 mg | ORAL_CAPSULE | Freq: Two times a day (BID) | ORAL | 0 refills | Status: DC

## 2019-06-28 MED ORDER — POLYETHYLENE GLYCOL 3350 17 GM/SCOOP PO POWD: 17.0000 g | Freq: Two times a day (BID) | ORAL | 1 refills | Status: DC | PRN

## 2019-06-28 MED ORDER — KETOROLAC TROMETHAMINE 60 MG/2ML IM SOLN: 60.0000 mg | Freq: Once | INTRAMUSCULAR | Status: DC

## 2019-06-28 MED ORDER — KETOROLAC TROMETHAMINE 10 MG PO TABS: 10.0000 mg | ORAL_TABLET | Freq: Four times a day (QID) | ORAL | 0 refills | Status: DC | PRN

## 2019-06-28 NOTE — Patient Instructions (Signed)
GETTING TO GOOD BOWEL HEALTH  Irregular bowel habits such as constipation can lead to many problems over time.  Having one soft bowel movement a day is the most important way to prevent further problems.    The goal: ONE SOFT BOWEL MOVEMENT A DAY!  To have soft, regular bowel movements:  . Drink at least 8 tall glasses of water a day.   . Take plenty of fiber.  Fiber is the undigested part of plant food that passes into the colon, acting s "natures broom" to encourage bowel motility and movement.  Fiber can absorb and hold large amounts of water. This results in a larger, bulkier stool, which is soft and easier to pass. Work gradually over several weeks up to 6 servings a day of fiber (25g a day even more if needed) in the form of: o Vegetables -- Root (potatoes, carrots, turnips), leafy green (lettuce, salad greens, celery, spinach), or cooked high residue (cabbage, broccoli, etc) o Fruit -- Fresh (unpeeled skin & pulp), Dried (prunes, apricots, cherries, etc ),  or stewed ( applesauce)  o Whole grain breads, pasta, etc (whole wheat)  o Bran cereals  . Bulking Agents -- This type of water-retaining fiber generally is easily obtained each day by one of the following:  o Psyllium bran -- The psyllium plant is remarkable because its ground seeds can retain so much water. This product is available as Metamucil, Konsyl, Effersyllium, Per Diem Fiber, or the less expensive generic preparation in drug and health food stores. Although labeled a laxative, it really is not a laxative.  o Methylcellulose -- This is another fiber derived from wood which also retains water. It is available as Citrucel. o Polyethylene Glycol - and "artificial" fiber commonly called Miralax or Glycolax.  It is helpful for people with gassy or bloated feelings with regular fiber o Flax Seed - a less gassy fiber than psyllium . No reading or other relaxing activity while on the toilet. If bowel movements take longer than 5 minutes,  you are too constipated. . AVOID CONSTIPATION.  High fiber and water intake usually takes care of this.  Sometimes a laxative is needed to stimulate more frequent bowel movements, but  . Laxatives are not a good long-term solution as it can wear the colon out. o Osmotics (Milk of Magnesia, Fleets phosphosoda, Magnesium citrate, MiraLax, GoLytely) are safer than  o Stimulants (Senokot, Castor Oil, Dulcolax, Ex Lax)    o Do not take laxatives for more than 7days in a row. .  IF SEVERELY CONSTIPATED, try a Bowel Retraining Program: o Do not use laxatives.  o Eat a diet high in roughage, such as bran cereals and leafy vegetables.  o Drink six (6) ounces of prune or apricot juice each morning.  o Eat two (2) large servings of stewed fruit each day.  o Take one (1) heaping tablespoon of a psyllium-based bulking agent twice a day. Use sugar-free sweetener when possible to avoid excessive calories.  o Eat a normal breakfast.  o Set aside 15 minutes after breakfast to sit on the toilet, but do not strain to have a bowel movement.  o If you do not have a bowel movement by the third day, use an enema and repeat the above steps.   

## 2019-06-28 NOTE — Progress Notes (Signed)
Anna Lindsey is a 73 y.o. female here for an acute visit.  History of Present Illness:   HPI: History of mechanical fall, hit right side on her kitchen cabinets.  She presented 3 days later for a previously scheduled visit and complained of pain in her right rib line.  A chest x-ray revealed for rib fractures.  She was given hydrocodone as needed pain control and red flags were discussed.  Unfortunately, she has not tolerated the hydrocodone.  Caused nausea.  She was transitioned to tramadol.  While it does help with the pain for short period time, her nausea has increased.  She has had episodes of vomiting.  She feels that her rib pain has improved slightly.  She feels that her breathing has improved somewhat.  No new fever, cough, or other red flags.  Her daughter is with her today.  PMHx, SurgHx, SocialHx, Medications, and Allergies were reviewed in the Visit Navigator and updated as appropriate.  Current Medications   .  aspirin 81 MG tablet, Take 81 mg by mouth daily., Disp: , Rfl:  .  atorvastatin (LIPITOR) 40 MG tablet, Take 40 mg by mouth daily., Disp: , Rfl:  .  citalopram (CELEXA) 20 MG tablet, TAKE 1 TABLET BY MOUTH ATBEDTIME., Disp: 90 tablet, Rfl: 0 .  Multiple Vitamin (MULTIVITAMIN) tablet, Take 1 tablet by mouth daily., Disp: , Rfl:  .  nortriptyline (PAMELOR) 75 MG capsule, Take 1 capsule (75 mg total) by mouth at bedtime., Disp: 90 capsule, Rfl: 0 .  ondansetron (ZOFRAN) 4 MG tablet, Take 1 tablet (4 mg total) by mouth every 8 (eight) hours as needed for nausea or vomiting., Disp: 20 tablet, Rfl: 0 .  traMADol (ULTRAM) 50 MG tablet, Take 1 tablet (50 mg total) by mouth every 8 (eight) hours as needed for up to 5 days., Disp: 15 tablet, Rfl: 0   Allergies  Allergen Reactions  . Ciprofloxacin Anxiety and Anaphylaxis   Review of Systems   Pertinent items are noted in the HPI. Otherwise, ROS is negative.  Vitals   Vitals:   06/28/19 1000  BP: (!) 145/62  Pulse: 84    Temp: 98.6 F (37 C)  TempSrc: Oral  SpO2: 96%  Weight: 108 lb (49 kg)     Body mass index is 20.08 kg/m.  Physical Exam   Physical Exam Vitals signs and nursing note reviewed.  Constitutional:      Appearance: She is not diaphoretic.  HENT:     Head: Normocephalic and atraumatic.  Eyes:     Extraocular Movements: Extraocular movements intact.  Neck:     Musculoskeletal: Normal range of motion and neck supple.  Cardiovascular:     Rate and Rhythm: Normal rate and regular rhythm.     Heart sounds: Normal heart sounds.  Pulmonary:     Effort: Pulmonary effort is normal. No tachypnea or accessory muscle usage.     Breath sounds: Examination of the right-lower field reveals decreased breath sounds. Decreased breath sounds present. No wheezing or rhonchi.  Chest:     Chest wall: Tenderness present.    Abdominal:     General: Abdomen is flat.     Palpations: Abdomen is soft.  Skin:    General: Skin is warm.  Neurological:     General: No focal deficit present.     Mental Status: She is alert.  Psychiatric:        Mood and Affect: Mood is anxious.  Behavior: Behavior normal.    Dg Chest 2 View  Result Date: 06/28/2019 CLINICAL DATA:  Right-sided rib pain for the past week. EXAM: CHEST - 2 VIEW COMPARISON:  Right rib x-rays dated June 19, 2019. FINDINGS: The heart size and mediastinal contours are within normal limits. Normal pulmonary vascularity. New small right-sided pneumothorax. Unchanged small right pleural effusion. No consolidation. Unchanged right lateral sixth through ninth rib fractures. IMPRESSION: 1. New small right-sided pneumothorax. 2. Unchanged small right pleural effusion. 3. Unchanged right lateral sixth through ninth rib fractures. These results will be called to the ordering clinician or representative by the Radiologist Assistant, and communication documented in the PACS or zVision Dashboard. Electronically Signed   By: Obie DredgeWilliam T Derry M.D.   On:  06/28/2019 15:24   Dg Ribs Unilateral W/chest Right  Result Date: 06/20/2019 CLINICAL DATA:  Larey SeatFell.  Right rib pain. EXAM: RIGHT RIBS AND CHEST - 3+ VIEW COMPARISON:  None. FINDINGS: The cardiac silhouette, mediastinal and hilar contours are within normal limits. There is a small right pleural effusion but no definite pneumothorax. No infiltrates are identified. No worrisome pulmonary lesions. Biapical pleural and parenchymal scarring changes. Two dedicated views of the right ribs demonstrate right sixth, seventh, eighth and ninth rib fractures. No significant displacement. IMPRESSION: 1. Right sixth, seventh, eighth and ninth rib fractures. 2. Small right pleural effusion but no pneumothorax or pulmonary contusion. Electronically Signed   By: Rudie MeyerP.  Gallerani M.D.   On: 06/20/2019 09:20   Assessment and Plan   Anna Lindsey was seen today for abdominal pain and nausea.  Diagnoses and all orders for this visit:  Adverse effect of drug, initial encounter Comments: Nausea and constipation with opiod use. Bowel regimen reviewed. Explained that Zofran also causes constipation.  Closed fracture of multiple ribs of right side with routine healing, subsequent encounter Comments: Transition to Toradol for a few days. Discussed stomach protection. Orders: -     DG Chest 2 View; Future -     ketorolac (TORADOL) injection 60 mg -     ketorolac (TORADOL) 10 MG tablet; Take 1 tablet (10 mg total) by mouth every 6 (six) hours as needed.  Drug-induced constipation -     docusate sodium (COLACE) 100 MG capsule; Take 1 capsule (100 mg total) by mouth 2 (two) times daily. -     polyethylene glycol powder (GLYCOLAX/MIRALAX) 17 GM/SCOOP powder; Take 17 g by mouth 2 (two) times daily as needed.  Nausea -     esomeprazole (NEXIUM) 40 MG capsule; Take 1 capsule (40 mg total) by mouth daily.   . Reviewed expectations re: course of current medical issues. . Discussed self-management of symptoms. . Outlined signs and  symptoms indicating need for more acute intervention. . Patient verbalized understanding and all questions were answered. Marland Kitchen. Health Maintenance issues including appropriate healthy diet, exercise, and smoking avoidance were discussed with patient. . See orders for this visit as documented in the electronic medical record. . Patient received an After Visit Summary.  Helane RimaErica Sekai Gitlin, DO [place], [facility] 07/01/2019

## 2019-06-28 NOTE — Telephone Encounter (Signed)
[facility] called to make Dr. Juleen China aware of Chest Xray impression for patient. I will send the results to Dr. Juleen China.

## 2019-06-30 NOTE — Telephone Encounter (Signed)
Noted. Will check in with patient and make sure no changes. Red flags already discussed at recent visit.

## 2019-07-02 ENCOUNTER — Other Ambulatory Visit: Payer: Self-pay

## 2019-07-02 DIAGNOSIS — S2241XD Multiple fractures of ribs, right side, subsequent encounter for fracture with routine healing: Secondary | ICD-10-CM

## 2019-07-09 ENCOUNTER — Telehealth: Payer: Self-pay | Admitting: Family Medicine

## 2019-07-09 NOTE — Telephone Encounter (Signed)
Poor lady. Is she taking the Tramadol anymore? We could try Neurontin 100 mg po q hs.

## 2019-07-09 NOTE — Telephone Encounter (Signed)
Patient is currently taking: Alive 220mg  tab : two bid  Tylenol 500mg  two tab tid   Medications are only working for about 2-3 hrs then it is only getting down to a tolerable. And ice pack. She wanted to know if she can use heat yet. As well as if there is anything stronger she can take at night she is not able to sleep much at all.

## 2019-07-09 NOTE — Telephone Encounter (Signed)
See note  Copied from [place] [phone]. Topic: General - Other >> Jul 09, 2019  2:32 PM Leward Quan A wrote: Reason for CRM: Patient called to request a call back from Dr [facility] nurse today if possible please say that the Aleve and Tylenol is not helping her pain. Asking can she get a stronger dose because she wake up at night still due to the pain. Please call patient at Ph#  [phone]

## 2019-07-09 NOTE — Telephone Encounter (Signed)
She is not taking anything other than the otc meds the tramadol helped a little when she had it. She has taken Neurontin in the past.

## 2019-07-10 MED ORDER — GABAPENTIN 100 MG PO CAPS: 100.0000 mg | ORAL_CAPSULE | Freq: Three times a day (TID) | ORAL | 3 refills | Status: DC

## 2019-07-10 MED ORDER — TRAMADOL HCL 50 MG PO TABS: 50.0000 mg | ORAL_TABLET | Freq: Three times a day (TID) | ORAL | 2 refills | Status: AC | PRN

## 2019-07-10 NOTE — Telephone Encounter (Signed)
Called let patient know will call if any issues.

## 2019-07-10 NOTE — Addendum Note (Signed)
Addended by: Briscoe Deutscher R on: 07/10/2019 06:27 AM   Modules accepted: Orders

## 2019-07-10 NOTE — Telephone Encounter (Signed)
I sent both to her pharmacy. Use Gabapentin TID, and Tramadol prn.

## 2019-07-11 ENCOUNTER — Other Ambulatory Visit: Payer: Self-pay

## 2019-07-11 ENCOUNTER — Ambulatory Visit (INDEPENDENT_AMBULATORY_CARE_PROVIDER_SITE_OTHER): Payer: Medicare HMO | Admitting: Psychiatry

## 2019-07-11 ENCOUNTER — Other Ambulatory Visit: Payer: Self-pay | Admitting: Family Medicine

## 2019-07-11 ENCOUNTER — Encounter (HOSPITAL_COMMUNITY): Payer: Self-pay | Admitting: Psychiatry

## 2019-07-11 DIAGNOSIS — F330 Major depressive disorder, recurrent, mild: Secondary | ICD-10-CM | POA: Diagnosis not present

## 2019-07-11 DIAGNOSIS — F411 Generalized anxiety disorder: Secondary | ICD-10-CM | POA: Diagnosis not present

## 2019-07-11 DIAGNOSIS — Z1231 Encounter for screening mammogram for malignant neoplasm of breast: Secondary | ICD-10-CM

## 2019-07-11 DIAGNOSIS — R69 Illness, unspecified: Secondary | ICD-10-CM | POA: Diagnosis not present

## 2019-07-11 MED ORDER — CITALOPRAM HYDROBROMIDE 20 MG PO TABS: ORAL_TABLET | ORAL | 0 refills | Status: DC

## 2019-07-11 MED ORDER — NORTRIPTYLINE HCL 75 MG PO CAPS: 75.0000 mg | ORAL_CAPSULE | Freq: Every day | ORAL | 0 refills | Status: DC

## 2019-07-11 NOTE — Progress Notes (Signed)
Virtual Visit via Telephone Note  I connected with [place] on 07/11/19 at 10:30 AM EDT by telephone and verified that I am speaking with the correct person using two identifiers.   I discussed the limitations, risks, security and privacy concerns of performing an evaluation and management service by telephone and the availability of in person appointments. I also discussed with the patient that there may be a patient responsible charge related to this service. The patient expressed understanding and agreed to proceed.   History of Present Illness: Patient was evaluated by phone session.  She is doing better now however 3 weeks ago she was in a lot of pain.  She fell and broke her rib.  She was giving narcotic but she could not tolerate and having nausea and severe dizziness.  Now she is taking gabapentin and tramadol.  She feels her anxiety is better now since her pain is better.  She is taking care of her 23 year old mother who has dementia.  Now she is getting some help from hospice as they come twice a week to give her a bath and nurse comes once a week to organize medication.  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  Patient now sleeping better.  She denies any irritability, crying spells or any feeling of hopelessness or worthlessness.  She is taking Celexa and nortriptyline.  She has mild tremors in her hand which is chronic.  She has no other side effects.  She denies drinking or using any illegal substances.  Her energy level is fair.  Her appetite is fair.  Patient recently had blood work and her WBC count and comprehensive panel is normal.  Past psychiatric history; Reviewed.    Recent Results (from the past 2160 hour(s))  CBC with Differential/Platelet     Status: None   Collection Time: 06/19/19  3:08 PM  Result Value Ref Range   WBC 7.0 4.0 - 10.5 K/uL   RBC 3.90 3.87 - 5.11 Mil/uL   Hemoglobin  12.1 12.0 - 15.0 g/dL   HCT 36.2 36.0 - 46.0 %   MCV 92.7 78.0 - 100.0 fl   MCHC 33.6 30.0 - 36.0 g/dL   RDW 13.2 11.5 - 15.5 %   Platelets 325.0 150.0 - 400.0 K/uL   Neutrophils Relative % 65.9 43.0 - 77.0 %   Lymphocytes Relative 22.3 12.0 - 46.0 %   Monocytes Relative 7.1 3.0 - 12.0 %   Eosinophils Relative 4.2 0.0 - 5.0 %   Basophils Relative 0.5 0.0 - 3.0 %   Neutro Abs 4.6 1.4 - 7.7 K/uL   Lymphs Abs 1.6 0.7 - 4.0 K/uL   Monocytes Absolute 0.5 0.1 - 1.0 K/uL   Eosinophils Absolute 0.3 0.0 - 0.7 K/uL   Basophils Absolute 0.0 0.0 - 0.1 K/uL  Comprehensive metabolic panel     Status: Abnormal   Collection Time: 06/19/19  3:08 PM  Result Value Ref Range   Sodium 131 (L) 135 - 145 mEq/L   Potassium 4.6 3.5 - 5.1 mEq/L   Chloride 94 (L) 96 - 112 mEq/L   CO2 28 19 - 32 mEq/L   Glucose, Bld 83 70 - 99 mg/dL   BUN 6 6 - 23 mg/dL   Creatinine, Ser 0.55 0.40 - 1.20 mg/dL   Total Bilirubin 0.9 0.2 - 1.2 mg/dL   Alkaline Phosphatase 62 39 - 117 U/L   AST  26 0 - 37 U/L   ALT 19 0 - 35 U/L   Total Protein 6.8 6.0 - 8.3 g/dL   Albumin 4.3 3.5 - 5.2 g/dL   Calcium 9.1 8.4 - 16.110.5 mg/dL   GFR 096.04108.58 >54.09>60.00 mL/min  Lipid panel     Status: None   Collection Time: 06/19/19  3:08 PM  Result Value Ref Range   Cholesterol 124 0 - 200 mg/dL    Comment: ATP III Classification       Desirable:  < 200 mg/dL               Borderline High:  200 - 239 mg/dL          High:  > = 811240 mg/dL   Triglycerides 91.485.0 0.0 - 149.0 mg/dL    Comment: Normal:  <782<150 mg/dLBorderline High:  150 - 199 mg/dL   HDL 95.6253.70 >13.08>39.00 mg/dL   VLDL 65.717.0 0.0 - 84.640.0 mg/dL   LDL Cholesterol 54 0 - 99 mg/dL   Total CHOL/HDL Ratio 2     Comment:                Men          Women1/2 Average Risk     3.4          3.3Average Risk          5.0          4.42X Average Risk          9.6          7.13X Average Risk          15.0          11.0                       NonHDL 70.57     Comment: NOTE:  Non-HDL goal should be 30 mg/dL higher  than patient's LDL goal (i.e. LDL goal of < 70 mg/dL, would have non-HDL goal of < 100 mg/dL)  VITAMIN D 25 Hydroxy (Vit-D Deficiency, Fractures)     Status: None   Collection Time: 06/19/19  3:08 PM  Result Value Ref Range   VITD 38.82 30.00 - 100.00 ng/mL      Psychiatric Specialty Exam: Physical Exam  ROS  Last menstrual period 08/06/2000.There is no height or weight on file to calculate BMI.  General Appearance: NA  Eye Contact:  NA  Speech:  Clear and Coherent  Volume:  Normal  Mood:  Dysphoric  Affect:  NA  Thought Process:  Goal Directed  Orientation:  Full (Time, Place, and Person)  Thought Content:  WDL  Suicidal Thoughts:  No  Homicidal Thoughts:  No  Memory:  Immediate;   Good Recent;   Good Remote;   Good  Judgement:  Good  Insight:  Good  Psychomotor Activity:  NA  Concentration:  Concentration: Good and Attention Span: Good  Recall:  Good  Fund of Knowledge:  Good  Language:  Good  Akathisia:  No  Handed:  Right  AIMS (if indicated):     Assets:  Communication Skills Desire for Improvement Housing Resilience Social Support  ADL's:  Intact  Cognition:  WNL  Sleep:   Fair      Assessment and Plan: Major depressive disorder, recurrent.  Generalized anxiety disorder.  I reviewed notes from other provider.  Her labs are normal including CBC, dentistry and vitamin D.  She is no longer taking hydrocodone  and Toradol.  She is taking tramadol and gabapentin 100 mg 3 times a day.  I discussed that gabapentin can also help her anxiety.  She wants to continue her current psychotropic medication.  I will continue Celexa 20 mg daily and Pamelor 75 mg at bedtime.  Discussed medication side effects and benefits.  Patient is getting some help from hospice to take care of 72 year old mother and that helps her a lot.  Recommended to call us back if she is any question or any concern.  Follow-up in 3 months.  Follow Up Instructions:    I discussed the assessment and  treatment plan with the patient. The patient was provided an opportunity to ask questions and all were answered. The patient agreed with the plan and demonstrated an understanding of the instructions.   The patient was advised to call back or seek an in-person evaluation if the symptoms worsen or if the condition fails to improve as anticipated.  I provided 20 minutes of non-face-to-face time during this encounter.   Cleotis NipperSyed T Arfeen, MD

## 2019-07-17 ENCOUNTER — Other Ambulatory Visit: Payer: Self-pay

## 2019-07-18 ENCOUNTER — Other Ambulatory Visit: Payer: Self-pay

## 2019-07-18 ENCOUNTER — Encounter: Payer: Self-pay | Admitting: Certified Nurse Midwife

## 2019-07-18 ENCOUNTER — Ambulatory Visit (INDEPENDENT_AMBULATORY_CARE_PROVIDER_SITE_OTHER): Payer: Medicare HMO | Admitting: Certified Nurse Midwife

## 2019-07-18 VITALS — BP 120/70 | HR 68 | Temp 97.2°F | Resp 16 | Ht 60.0 in | Wt 114.0 lb

## 2019-07-18 DIAGNOSIS — Z01419 Encounter for gynecological examination (general) (routine) without abnormal findings: Secondary | ICD-10-CM

## 2019-07-18 NOTE — Patient Instructions (Signed)

## 2019-07-18 NOTE — Progress Notes (Signed)
73 y.o. G34P1011 Married  Caucasian Fe here for annual exam. Post menopausal denies vaginal bleeding or vaginal dryness. Not sexually active her preference. Eating well and exercising to stay healthy. Sees PCP yearly for labs, cholesterol, insomnia, anxiety and aex. All medication stable. No health issues today.  Patient's last menstrual period was 08/06/2000.          Sexually active: No.  The current method of family planning is post menopausal status.    Exercising: Yes.    walking Smoker:  no  Review of Systems  Constitutional: Negative.   HENT: Negative.   Eyes: Negative.   Respiratory: Negative.   Cardiovascular: Negative.   Gastrointestinal: Negative.   Genitourinary: Negative.   Musculoskeletal: Negative.   Skin: Negative.   Neurological: Negative.   Endo/Heme/Allergies: Negative.   Psychiatric/Behavioral: Negative.     Health Maintenance: Pap:  06-17-17 neg  History of Abnormal Pap: yes MMG:  01-23-18 category c density birads 1:neg, scheduled for 08/2019 Self Breast exams: occ Colonoscopy:  2015 f/u 16yrs  BMD:   2019 TDaP:  2018 Shingles: 2020 Pneumonia: 2020 Hep C and HIV: hep c neg 2017, HIV neg yrs ago Labs: if needed   reports that she has never smoked. She has never used smokeless tobacco. She reports that she does not drink alcohol or use drugs.  Past Medical History:  Diagnosis Date  . Anxiety   . Broken ankle    crushed heel 2013  . Condyloma   . Depression   . Dysplasia of cervix, low grade (CIN 1) 1995   LEEP  . Fibromyalgia   . H/O: rheumatic fever   . Hepatitis B    with complete resolution- documentation - [facility]    Past Surgical History:  Procedure Laterality Date  . BLADDER SURGERY  1980  . EXPLORATORY LAPAROTOMY     Due to bladder injury with fall of a horse  . HYSTEROSCOPY  2004   D&C Polyp  . LEEP    . TONSILLECTOMY     childhood    Current Outpatient Medications  Medication Sig Dispense Refill  . aspirin 81 MG tablet Take 81 mg  by mouth daily.    Marland Kitchen atorvastatin (LIPITOR) 40 MG tablet Take 40 mg by mouth daily.    . Calcium Carbonate-Vitamin D (CALCIUM + D PO) Take 600 mg by mouth daily.    . citalopram (CELEXA) 20 MG tablet TAKE 1 TABLET BY MOUTH ATBEDTIME. 90 tablet 0  . docusate sodium (COLACE) 100 MG capsule Take 1 capsule (100 mg total) by mouth 2 (two) times daily. 10 capsule 0  . esomeprazole (NEXIUM) 40 MG capsule Take 1 capsule (40 mg total) by mouth daily. 30 capsule 3  . gabapentin (NEURONTIN) 100 MG capsule Take 1 capsule (100 mg total) by mouth 3 (three) times daily. 90 capsule 3  . Multiple Vitamin (MULTIVITAMIN) tablet Take 1 tablet by mouth daily.    . nortriptyline (PAMELOR) 75 MG capsule Take 1 capsule (75 mg total) by mouth at bedtime. 90 capsule 0  . polyethylene glycol powder (GLYCOLAX/MIRALAX) 17 GM/SCOOP powder Take 17 g by mouth 2 (two) times daily as needed. 3350 g 1   No current facility-administered medications for this visit.     Family History  Problem Relation Age of Onset  . Anxiety disorder Mother   . Cancer Mother        skin  . Thyroid disease Mother   . Heart disease Mother  afib  . Depression Father   . Colon cancer Father   . Hypertension Father   . Depression Sister   . Kidney disease Maternal Grandmother   . Parkinson's disease Maternal Grandfather   . Diabetes Paternal Grandmother   . Heart disease Paternal Grandfather   . Breast cancer Paternal Aunt     ROS:  Pertinent items are noted in HPI.  Otherwise, a comprehensive ROS was negative.  Exam:   LMP 08/06/2000    Ht Readings from Last 3 Encounters:  06/19/19 5' 1.5" (1.562 m)  06/21/18 4' 11.75" (1.518 m)  06/17/17 5' 0.25" (1.53 m)    General appearance: alert, cooperative and appears stated age Head: Normocephalic, without obvious abnormality, atraumatic Neck: no adenopathy, supple, symmetrical, trachea midline and thyroid normal to inspection and palpation Lungs: clear to auscultation  bilaterally Breasts: normal appearance, no masses or tenderness, No nipple retraction or dimpling, No nipple discharge or bleeding, No axillary or supraclavicular adenopathy Heart: regular rate and rhythm Abdomen: soft, non-tender; no masses,  no organomegaly Extremities: extremities normal, atraumatic, no cyanosis or edema Skin: Skin color, texture, turgor normal. No rashes or lesions Lymph nodes: Cervical, supraclavicular, and axillary nodes normal. No abnormal inguinal nodes palpated Neurologic: Grossly normal   Pelvic: External genitalia:  no lesions              Urethra:  normal appearing urethra with no masses, tenderness or lesions              Bartholin's and Skene's: normal                 Vagina: normal appearing vagina with normal color and discharge, no lesions              Cervix: no cervical motion tenderness, no lesions and normal appearance              Pap taken: No. Bimanual Exam:  Uterus:  normal size, contour, position, consistency, mobility, non-tender and anteverted              Adnexa: normal adnexa and no mass, fullness, tenderness               Rectovaginal: Confirms               Anus:  normal sphincter tone, no lesions  Chaperone present: yes  A:  Well Woman with normal exam  Post menopausal no HRT  Cholesterol/anxiety/ insomnia management with PCP all stable per patient  Mammogram and colonoscopy due    P:   Reviewed health and wellness pertinent to exam  Aware of need to advise if vaginal bleeding  Continue follow up with PCP as indicated  Stressed importance of SBE and mammogram yearly. Aware of colonoscopy due positive finding with last colonoscopy. Patient will schedule.  Pap smear: no   counseled on breast self exam, mammography screening, adequate intake of calcium and vitamin D, diet and exercise, Kegel's exercises  return annually or prn  An After Visit Summary was printed and given to the patient.

## 2019-07-19 DIAGNOSIS — E785 Hyperlipidemia, unspecified: Secondary | ICD-10-CM | POA: Diagnosis not present

## 2019-07-19 DIAGNOSIS — R69 Illness, unspecified: Secondary | ICD-10-CM | POA: Diagnosis not present

## 2019-07-19 DIAGNOSIS — M797 Fibromyalgia: Secondary | ICD-10-CM | POA: Diagnosis not present

## 2019-07-27 ENCOUNTER — Ambulatory Visit: Payer: Self-pay

## 2019-07-27 DIAGNOSIS — S2241XD Multiple fractures of ribs, right side, subsequent encounter for fracture with routine healing: Secondary | ICD-10-CM

## 2019-07-27 NOTE — Telephone Encounter (Signed)
Patient is calling regarding her continued rib pain.  She is taking Gabapentin 100 mg three times per day for the pain. States the medication does not seem to be lasting long enough as the pain is waking her up at night.  She states when she was taking tramadol along with Gabapentin at bedtime the pain did not wake her up at night.  She is requesting recommendations from Dr. Juleen China.

## 2019-07-27 NOTE — Telephone Encounter (Signed)
See note

## 2019-07-28 MED ORDER — TRAMADOL HCL 50 MG PO TABS: 50.0000 mg | ORAL_TABLET | Freq: Four times a day (QID) | ORAL | 0 refills | Status: DC | PRN

## 2019-07-28 NOTE — Addendum Note (Signed)
Addended by: Briscoe Deutscher R on: 07/28/2019 08:04 AM   Modules accepted: Orders

## 2019-07-28 NOTE — Telephone Encounter (Signed)
Tramadol sent in. Sports Medicine follow up ordered.

## 2019-07-30 ENCOUNTER — Other Ambulatory Visit: Payer: Self-pay

## 2019-07-30 DIAGNOSIS — S2241XD Multiple fractures of ribs, right side, subsequent encounter for fracture with routine healing: Secondary | ICD-10-CM

## 2019-07-30 NOTE — Telephone Encounter (Signed)
Called patient gave information. She requested appointment made with Dr. Rhona Raider she has seen him in the past. I have closed open referral and started new one.

## 2019-08-04 DIAGNOSIS — R69 Illness, unspecified: Secondary | ICD-10-CM | POA: Diagnosis not present

## 2019-08-10 DIAGNOSIS — S2241XA Multiple fractures of ribs, right side, initial encounter for closed fracture: Secondary | ICD-10-CM | POA: Diagnosis not present

## 2019-08-17 ENCOUNTER — Telehealth: Payer: Self-pay | Admitting: Certified Nurse Midwife

## 2019-08-20 DIAGNOSIS — Z9181 History of falling: Secondary | ICD-10-CM | POA: Diagnosis not present

## 2019-08-21 DIAGNOSIS — Z9181 History of falling: Secondary | ICD-10-CM | POA: Diagnosis not present

## 2019-08-23 ENCOUNTER — Telehealth: Payer: Self-pay | Admitting: Certified Nurse Midwife

## 2019-08-23 ENCOUNTER — Ambulatory Visit
Admission: RE | Admit: 2019-08-23 | Discharge: 2019-08-23 | Disposition: A | Discharge status: Home / Self Care | Payer: Medicare HMO | Source: Ambulatory Visit | Attending: Family Medicine | Admitting: Family Medicine

## 2019-08-23 ENCOUNTER — Other Ambulatory Visit: Payer: Self-pay

## 2019-08-23 DIAGNOSIS — Z1231 Encounter for screening mammogram for malignant neoplasm of breast: Secondary | ICD-10-CM | POA: Diagnosis not present

## 2019-08-23 NOTE — Telephone Encounter (Signed)
Patient want to discuss weaning herself off nortriptyline.

## 2019-08-23 NOTE — Telephone Encounter (Signed)
Left message to call Marcus Groll, RN at [facility] [phone].   

## 2019-08-24 NOTE — Telephone Encounter (Signed)
Spoke with patient. Patient is requesting to wean off of nortriptyline. States she has been on it along time and would like to come off. Advised patient this medication is not prescribed or managed by Melvia Heaps, CNM, she would need to f/u with the prescribing MD, Dr. Adele Schilder, for recommendations. Patient verbalizes understanding and is agreeable.   Routing to provider for final review. Patient is agreeable to disposition. Will close encounter.

## 2019-08-27 DIAGNOSIS — Z9181 History of falling: Secondary | ICD-10-CM | POA: Diagnosis not present

## 2019-08-29 DIAGNOSIS — Z9181 History of falling: Secondary | ICD-10-CM | POA: Diagnosis not present

## 2019-09-03 DIAGNOSIS — Z9181 History of falling: Secondary | ICD-10-CM | POA: Diagnosis not present

## 2019-09-05 DIAGNOSIS — Z9181 History of falling: Secondary | ICD-10-CM | POA: Diagnosis not present

## 2019-09-13 DIAGNOSIS — Z9181 History of falling: Secondary | ICD-10-CM | POA: Diagnosis not present

## 2019-09-18 DIAGNOSIS — Z9181 History of falling: Secondary | ICD-10-CM | POA: Diagnosis not present

## 2019-10-11 ENCOUNTER — Ambulatory Visit (INDEPENDENT_AMBULATORY_CARE_PROVIDER_SITE_OTHER): Payer: Medicare HMO | Admitting: Psychiatry

## 2019-10-11 ENCOUNTER — Other Ambulatory Visit: Payer: Self-pay

## 2019-10-11 ENCOUNTER — Encounter (HOSPITAL_COMMUNITY): Payer: Self-pay | Admitting: Psychiatry

## 2019-10-11 DIAGNOSIS — F330 Major depressive disorder, recurrent, mild: Secondary | ICD-10-CM | POA: Diagnosis not present

## 2019-10-11 DIAGNOSIS — R69 Illness, unspecified: Secondary | ICD-10-CM | POA: Diagnosis not present

## 2019-10-11 DIAGNOSIS — F411 Generalized anxiety disorder: Secondary | ICD-10-CM | POA: Diagnosis not present

## 2019-10-11 MED ORDER — NORTRIPTYLINE HCL 75 MG PO CAPS: 75.0000 mg | ORAL_CAPSULE | Freq: Every day | ORAL | 0 refills | Status: DC

## 2019-10-11 MED ORDER — CITALOPRAM HYDROBROMIDE 20 MG PO TABS: ORAL_TABLET | ORAL | 0 refills | Status: DC

## 2019-10-11 NOTE — Progress Notes (Signed)
Virtual Visit via Telephone Note  I connected with Anton on 10/11/19 at  2:00 PM EST by telephone and verified that I am speaking with the correct person using two identifiers.   I discussed the limitations, risks, security and privacy concerns of performing an evaluation and management service by telephone and the availability of in person appointments. I also discussed with the patient that there may be a patient responsible charge related to this service. The patient expressed understanding and agreed to proceed.   History of Present Illness: Patient was evaluated by phone session.  She is doing much better and currently not taking any pain medication including gabapentin.  She is sleeping better.  She is still spent a lot of time taking care of her on 72-year-old mother who has dementia.  She is pleased that she is getting help from hospice and they come twice a week to give her bath and nurse comes once a week to organize her mother's medication.  She get help from her daughter and sometime her husband comes to take care of the mother when she has to run for her own needs.  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  She feels the current medicine is working.  She denies any anxiety, nervousness, irritability, crying spells or any feeling of hopelessness or worthlessness.  Her energy level is good.  She does not want to change medication since everything is going well.  Her appetite is okay.  Her weight is unchanged from the past.   Past Psychiatric History; H/O depression since 7s.  No h/o mania, psychosis, suicidal attempt or inpatient. Saw therapist on and off. We tried Lamictal but stopped due to rash.    Psychiatric Specialty Exam: Physical Exam  ROS  Last menstrual period 08/06/2000.There is no height or weight on file to calculate BMI.  General Appearance: NA  Eye Contact:  NA  Speech:  Clear and Coherent and Normal Rate  Volume:  Normal  Mood:  Euthymic   Affect:  NA  Thought Process:  Goal Directed  Orientation:  Full (Time, Place, and Person)  Thought Content:  WDL  Suicidal Thoughts:  No  Homicidal Thoughts:  No  Memory:  Immediate;   Good Recent;   Good Remote;   Good  Judgement:  Good  Insight:  Good  Psychomotor Activity:  NA  Concentration:  Concentration: Good and Attention Span: Good  Recall:  Good  Fund of Knowledge:  Good  Language:  Good  Akathisia:  NA  Handed:  Right  AIMS (if indicated):     Assets:  Communication Skills Desire for Improvement Housing Resilience Social Support  ADL's:  Intact  Cognition:  WNL  Sleep:   ok      Assessment and Plan: Major depressive disorder, recurrent.  Generalized anxiety disorder.  Patient doing well on her current medication.  Despite chronic stress from her elderly mother she is handling situation much better.  She is no longer taking gabapentin which was given for pain.  She like to continue Celexa 20 mg and Pamelor 75 mg at bedtime.  Discussed medication side effects and benefits.  Recommended to call us back if she has any question or any concern.  Follow-up in 3 months.  Follow Up Instructions:    I discussed the assessment and treatment plan with the patient. The patient was provided an opportunity to ask questions and all were answered. The patient agreed with the plan and demonstrated  an understanding of the instructions.   The patient was advised to call back or seek an in-person evaluation if the symptoms worsen or if the condition fails to improve as anticipated.  I provided 20 minutes of non-face-to-face time during this encounter.   Kathlee Nations, MD

## 2019-10-25 DIAGNOSIS — M797 Fibromyalgia: Secondary | ICD-10-CM | POA: Diagnosis not present

## 2019-10-25 DIAGNOSIS — F341 Dysthymic disorder: Secondary | ICD-10-CM | POA: Diagnosis not present

## 2019-10-25 DIAGNOSIS — R69 Illness, unspecified: Secondary | ICD-10-CM | POA: Diagnosis not present

## 2019-10-25 DIAGNOSIS — E785 Hyperlipidemia, unspecified: Secondary | ICD-10-CM | POA: Diagnosis not present

## 2019-12-28 ENCOUNTER — Ambulatory Visit: Payer: Medicare HMO | Attending: Internal Medicine

## 2019-12-28 ENCOUNTER — Telehealth: Payer: Self-pay | Admitting: Certified Nurse Midwife

## 2019-12-28 DIAGNOSIS — Z23 Encounter for immunization: Secondary | ICD-10-CM | POA: Insufficient documentation

## 2019-12-28 NOTE — Telephone Encounter (Signed)
Patient is wanting [name] to let her know if it's ok for her to take covid vaccine.

## 2019-12-28 NOTE — Telephone Encounter (Signed)
Spoke with patient. Advised per Leota Sauers, CNM. Patient states she received her first Covid19 vaccine today. Patient thankful for call.   Routing to provider for final review. Patient is agreeable to disposition. Will close encounter.

## 2019-12-28 NOTE — Telephone Encounter (Signed)
Routing to Deborah Leonard, CNM to review.  

## 2019-12-28 NOTE — Progress Notes (Signed)
   Covid-19 Vaccination Clinic  Name:  Anna Lindsey    MRN: [id number] DOB: 1946/11/12  12/28/2019  Anna Lindsey was observed post Covid-19 immunization for 15 minutes without incidence. She was provided with Vaccine Information Sheet and instruction to access the V-Safe system.   Anna Lindsey was instructed to call 911 with any severe reactions post vaccine: Marland Kitchen Difficulty breathing  . Swelling of your face and throat  . A fast heartbeat  . A bad rash all over your body  . Dizziness and weakness    Immunizations Administered    Name Date Dose VIS Date Route   Pfizer COVID-19 Vaccine 12/28/2019 12:20 PM 0.3 mL 11/16/2019 Intramuscular   Manufacturer: [employer], [employer]   Lot: [id number]   NDC: [address]-8

## 2019-12-28 NOTE — Telephone Encounter (Signed)
See telephone note regarding taking Covid vaccine. I have reviewed her gyn record and see no contraindications, but she should check with her PCP if she is concerned.

## 2020-01-09 ENCOUNTER — Encounter (HOSPITAL_COMMUNITY): Payer: Self-pay | Admitting: Psychiatry

## 2020-01-09 ENCOUNTER — Ambulatory Visit (INDEPENDENT_AMBULATORY_CARE_PROVIDER_SITE_OTHER): Payer: Medicare HMO | Admitting: Psychiatry

## 2020-01-09 ENCOUNTER — Other Ambulatory Visit: Payer: Self-pay

## 2020-01-09 DIAGNOSIS — R69 Illness, unspecified: Secondary | ICD-10-CM | POA: Diagnosis not present

## 2020-01-09 DIAGNOSIS — F411 Generalized anxiety disorder: Secondary | ICD-10-CM

## 2020-01-09 DIAGNOSIS — F330 Major depressive disorder, recurrent, mild: Secondary | ICD-10-CM

## 2020-01-09 MED ORDER — CITALOPRAM HYDROBROMIDE 20 MG PO TABS: ORAL_TABLET | ORAL | 0 refills | Status: DC

## 2020-01-09 MED ORDER — NORTRIPTYLINE HCL 75 MG PO CAPS: 75.0000 mg | ORAL_CAPSULE | Freq: Every day | ORAL | 0 refills | Status: DC

## 2020-01-09 NOTE — Progress Notes (Addendum)
Virtual Visit via Telephone Note  I connected with Anna Lindsey on 01/09/20 at  2:00 PM EST by telephone and verified that I am speaking with the correct person using two identifiers.   I discussed the limitations, risks, security and privacy concerns of performing an evaluation and management service by telephone and the availability of in person appointments. I also discussed with the patient that there may be a patient responsible charge related to this service. The patient expressed understanding and agreed to proceed.   History of Present Illness: Patient was evaluated by phone session.  She is a stable on her current medication.  Mother is going to be 36 years old in april.  She stays with the patient and patient is responsible for taking care of her mother.  Sometimes her daughter comes and help her.  She feels things are going okay.  She has coping skills when she gets stressed she usually walk or listen to music.  She feels things are going well and she does not want to change medication.  Her sleep is good.  She had a quite Christmas.  Her daughter came and they have a good family time.  Patient denies any irritability, crying spells, feeling of hopelessness or any panic attack.  She has no tremors, shakes.  Her energy level is good.  Her appetite is okay.  Her weight is unchanged from the past.   Past Psychiatric History; H/O depression since 1s. No h/o mania, psychosis, suicidal attempt or inpatient. Saw therapist on and off. We tried Lamictal but stopped due to rash.     Psychiatric Specialty Exam: Physical Exam  Review of Systems  Last menstrual period 08/06/2000.There is no height or weight on file to calculate BMI.  General Appearance: NA  Eye Contact:  NA  Speech:  Clear and Coherent and Normal Rate  Volume:  Normal  Mood:  Euthymic  Affect:  NA  Thought Process:  Goal Directed  Orientation:  Full (Time, Place, and Person)  Thought Content:  WDL  Suicidal Thoughts:   No  Homicidal Thoughts:  No  Memory:  Immediate;   Good Recent;   Good Remote;   Good  Judgement:  Good  Insight:  Good  Psychomotor Activity:  NA  Concentration:  Concentration: Good and Attention Span: Good  Recall:  Good  Fund of Knowledge:  Good  Language:  Good  Akathisia:  No  Handed:  Right  AIMS (if indicated):     Assets:  Communication Skills Desire for Improvement Housing Resilience Social Support  ADL's:  Intact  Cognition:  WNL  Sleep:   good      Assessment and Plan: Major depressive disorder, recurrent.  Generalized anxiety disorder.  Patient is a stable on her current medication.  She is no longer taking gabapentin.  She wants to continue her current psychotropic medication.  She has no side effects.  Continue Celexa 20 mg daily and Pamelor 75 mg at bedtime.  Recommended to call us back if she is any question of any concern.  Follow-up in 3 months.  Follow Up Instructions:    I discussed the assessment and treatment plan with the patient. The patient was provided an opportunity to ask questions and all were answered. The patient agreed with the plan and demonstrated an understanding of the instructions.   The patient was advised to call back or seek an in-person evaluation if the symptoms worsen or if the condition fails to improve as anticipated.  I provided 20 minutes of non-face-to-face time during this encounter.   Kathlee Nations, MD

## 2020-01-18 ENCOUNTER — Ambulatory Visit: Payer: Medicare HMO | Attending: Internal Medicine

## 2020-01-18 DIAGNOSIS — Z23 Encounter for immunization: Secondary | ICD-10-CM | POA: Insufficient documentation

## 2020-01-18 NOTE — Progress Notes (Signed)
   Covid-19 Vaccination Clinic  Name:  SHARAYA BORUFF    MRN: [id number] DOB: 1946/07/22  01/18/2020  Ms. Nosbisch was observed post Covid-19 immunization for 15 minutes without incidence. She was provided with Vaccine Information Sheet and instruction to access the V-Safe system.   Ms. Meding was instructed to call 911 with any severe reactions post vaccine: Marland Kitchen Difficulty breathing  . Swelling of your face and throat  . A fast heartbeat  . A bad rash all over your body  . Dizziness and weakness    Immunizations Administered    Name Date Dose VIS Date Route   Pfizer COVID-19 Vaccine 01/18/2020  9:55 AM 0.3 mL 11/16/2019 Intramuscular   Manufacturer: [employer], [employer]   Lot: [id number]   NDC: [address]-9

## 2020-01-24 DIAGNOSIS — R69 Illness, unspecified: Secondary | ICD-10-CM | POA: Diagnosis not present

## 2020-01-24 DIAGNOSIS — M797 Fibromyalgia: Secondary | ICD-10-CM | POA: Diagnosis not present

## 2020-01-24 DIAGNOSIS — R030 Elevated blood-pressure reading, without diagnosis of hypertension: Secondary | ICD-10-CM | POA: Diagnosis not present

## 2020-01-24 DIAGNOSIS — E785 Hyperlipidemia, unspecified: Secondary | ICD-10-CM | POA: Diagnosis not present

## 2020-02-11 DIAGNOSIS — E785 Hyperlipidemia, unspecified: Secondary | ICD-10-CM | POA: Diagnosis not present

## 2020-02-11 DIAGNOSIS — I10 Essential (primary) hypertension: Secondary | ICD-10-CM | POA: Diagnosis not present

## 2020-02-27 ENCOUNTER — Encounter: Payer: Self-pay | Admitting: Certified Nurse Midwife

## 2020-03-03 ENCOUNTER — Other Ambulatory Visit (HOSPITAL_COMMUNITY): Payer: Self-pay

## 2020-03-03 DIAGNOSIS — F330 Major depressive disorder, recurrent, mild: Secondary | ICD-10-CM

## 2020-03-03 DIAGNOSIS — F411 Generalized anxiety disorder: Secondary | ICD-10-CM

## 2020-03-03 MED ORDER — CITALOPRAM HYDROBROMIDE 20 MG PO TABS: ORAL_TABLET | ORAL | 0 refills | Status: DC

## 2020-03-03 MED ORDER — NORTRIPTYLINE HCL 75 MG PO CAPS: 75.0000 mg | ORAL_CAPSULE | Freq: Every day | ORAL | 0 refills | Status: DC

## 2020-04-07 ENCOUNTER — Encounter (HOSPITAL_COMMUNITY): Payer: Self-pay | Admitting: Psychiatry

## 2020-04-07 ENCOUNTER — Telehealth (INDEPENDENT_AMBULATORY_CARE_PROVIDER_SITE_OTHER): Payer: Medicare HMO | Admitting: Psychiatry

## 2020-04-07 ENCOUNTER — Other Ambulatory Visit: Payer: Self-pay

## 2020-04-07 DIAGNOSIS — F411 Generalized anxiety disorder: Secondary | ICD-10-CM

## 2020-04-07 DIAGNOSIS — F330 Major depressive disorder, recurrent, mild: Secondary | ICD-10-CM | POA: Diagnosis not present

## 2020-04-07 MED ORDER — NORTRIPTYLINE HCL 75 MG PO CAPS: 75.0000 mg | ORAL_CAPSULE | Freq: Every day | ORAL | 0 refills | Status: DC

## 2020-04-07 MED ORDER — CITALOPRAM HYDROBROMIDE 20 MG PO TABS: ORAL_TABLET | ORAL | 0 refills | Status: DC

## 2020-04-07 NOTE — Progress Notes (Signed)
Virtual Visit via Telephone Note  I connected with Anna Lindsey on 04/07/20 at  1:40 PM EDT by telephone and verified that I am speaking with the correct person using two identifiers.   I discussed the limitations, risks, security and privacy concerns of performing an evaluation and management service by telephone and the availability of in person appointments. I also discussed with the patient that there may be a patient responsible charge related to this service. The patient expressed understanding and agreed to proceed.   History of Present Illness: Evaluated by phone in session.  She is taking her medication and seems they are working but sometimes she burnout by taking care of her parents 73 year old mother.  Last month she had a birthday and her daughter invited media.  Patient told her mother started to gradually decline in her health.  She sleeps most of the time and does not bother her as much as she used to in the past.  Patient is also sleeping good.  Sometimes she feels overwhelmed and wondering if she can start therapy to talk about her own feeling towards her mother.  Recently she has changed her insurance and I recommend she should contact her insurance company to get therapist information so she should remain in network.  Her husband also started new job and that is somewhat relieved.  Her daughter sometimes comes and help her.  Patient denies any crying spells or any feeling of hopelessness.  She feels the current medicine is working and she is sleeping better.  Her weight fluctuates from 108 110.  Patient has no tremors or shakes.   Past Psychiatric History; H/Odepressionsince1980s.No h/o mania, psychosis, suicidal attempt or inpatient. [place]. We tried[place]ictalbut stopped due torash.   Psychiatric Specialty Exam: Physical Exam  Review of Systems  Last menstrual period 08/06/2000.There is no height or weight on file to calculate BMI.  General  Appearance: NA  Eye Contact:  NA  Speech:  Clear and Coherent  Volume:  Normal  Mood:  Anxious  Affect:  NA  Thought Process:  Goal Directed  Orientation:  Full (Time, Place, and Person)  Thought Content:  Logical  Suicidal Thoughts:  No  Homicidal Thoughts:  No  Memory:  Immediate;   Good Recent;   Good Remote;   Good  Judgement:  Good  Insight:  Good  Psychomotor Activity:  NA  Concentration:  Concentration: Good and Attention Span: Good  Recall:  Good  Fund of Knowledge:  Good  Language:  Good  Akathisia:  No  Handed:  Right  AIMS (if indicated):     Assets:  Communication Skills Desire for Improvement Housing Resilience  ADL's:  Intact  Cognition:  WNL  Sleep:   ok      Assessment and Plan: Major depressive disorder, recurrent.  Generalized anxiety disorder.  Discuss caretaker burden as patient is taking care of her mother who is now 32 years old.  She agree that she will call her insurance to get information about therapy.  She does not want to change medication.  Continue Pamelor 75 mg at bedtime and Celexa 20 mg daily.  Recommended to call us back if she has any question of any concern.  Follow-up in 3 months.  She like to have her prescription to [facility] mail.  Follow Up Instructions:    I discussed the assessment and treatment plan with the patient. The patient was provided an opportunity to ask questions and all were answered. The patient agreed  with the plan and demonstrated an understanding of the instructions.   The patient was advised to call back or seek an in-person evaluation if the symptoms worsen or if the condition fails to improve as anticipated.  I provided 20 minutes of non-face-to-face time during this encounter.   Kathlee Nations, MD

## 2020-04-24 DIAGNOSIS — E785 Hyperlipidemia, unspecified: Secondary | ICD-10-CM | POA: Diagnosis not present

## 2020-04-24 DIAGNOSIS — M797 Fibromyalgia: Secondary | ICD-10-CM | POA: Diagnosis not present

## 2020-04-24 DIAGNOSIS — F419 Anxiety disorder, unspecified: Secondary | ICD-10-CM | POA: Diagnosis not present

## 2020-04-24 DIAGNOSIS — F341 Dysthymic disorder: Secondary | ICD-10-CM | POA: Diagnosis not present

## 2020-07-08 ENCOUNTER — Telehealth (HOSPITAL_COMMUNITY): Payer: Medicare HMO | Admitting: Psychiatry

## 2020-07-09 ENCOUNTER — Encounter (HOSPITAL_COMMUNITY): Payer: Self-pay | Admitting: Psychiatry

## 2020-07-09 ENCOUNTER — Telehealth (INDEPENDENT_AMBULATORY_CARE_PROVIDER_SITE_OTHER): Payer: Medicare HMO | Admitting: Psychiatry

## 2020-07-09 ENCOUNTER — Other Ambulatory Visit: Payer: Self-pay

## 2020-07-09 VITALS — Wt 110.0 lb

## 2020-07-09 DIAGNOSIS — F419 Anxiety disorder, unspecified: Secondary | ICD-10-CM | POA: Diagnosis not present

## 2020-07-09 DIAGNOSIS — F330 Major depressive disorder, recurrent, mild: Secondary | ICD-10-CM

## 2020-07-09 MED ORDER — CITALOPRAM HYDROBROMIDE 20 MG PO TABS: ORAL_TABLET | ORAL | 0 refills | Status: DC

## 2020-07-09 MED ORDER — NORTRIPTYLINE HCL 75 MG PO CAPS: 75.0000 mg | ORAL_CAPSULE | Freq: Every day | ORAL | 0 refills | Status: DC

## 2020-07-09 NOTE — Progress Notes (Signed)
Virtual Visit via Telephone Note  I connected with Anna Lindsey on 07/09/20 at  2:40 PM EDT by telephone and verified that I am speaking with the correct person using two identifiers.  Location: Patient: home Provider: home office   I discussed the limitations, risks, security and privacy concerns of performing an evaluation and management service by telephone and the availability of in person appointments. I also discussed with the patient that there may be a patient responsible charge related to this service. The patient expressed understanding and agreed to proceed.   History of Present Illness: Patient is evaluated by phone session.  She is taking her medication as prescribed.  She also started counseling through the hospice whenever she feels under a lot of stress.  She is happy because counselor comes at home if she needed.  She admitted some time burnout taking care of elderly mother who is 25 and 69 years old.  Her daughter also comes to help her when she needed.  She is sleeping okay.  She denies any crying spells or any feeling of hopelessness or worthlessness.  She feels her husband is not as active and sometimes he gets tired and since he started his new job he is more exhausted.  However her husband is still supports her a lot.  Patient like to keep her current medication.  Her weight fluctuates from 108-110.  Patient has no tremors shakes or any EPS.   Past Psychiatric History; H/[place]depressions[place]1980s.No h/o mania, psychosis, suicidal attempt or inpatient. [place] and [place]. We tried[place]ictalbut stopped due torash.   Psychiatric Specialty Exam: Physical Exam  Review of Systems  Weight 110 lb (49.9 kg), last menstrual period 08/06/2000.There is no height or weight on file to calculate BMI.  General Appearance: NA  Eye Contact:  NA  Speech:  Clear and Coherent  Volume:  Normal  Mood:  Euthymic  Affect:  NA  Thought Process:  Goal Directed  Orientation:  Full  (Time, Place, and Person)  Thought Content:  Logical  Suicidal Thoughts:  No  Homicidal Thoughts:  No  Memory:  Immediate;   Good Recent;   Good Remote;   Good  Judgement:  Good  Insight:  Good  Psychomotor Activity:  NA  Concentration:  Concentration: Good and Attention Span: Good  Recall:  Good  Fund of Knowledge:  Good  Language:  Good  Akathisia:  No  Handed:  Right  AIMS (if indicated):     Assets:  Communication Skills Desire for Improvement Housing Resilience Social Support Transportation  ADL's:  Intact  Cognition:  WNL  Sleep:   ok      Assessment and Plan: Major depressive disorder, recurrent.  Anxiety.  Patient is stable on her current medication.  She has chronic anxiety and she gets frustrated as taking care of her elderly mother.  I encouraged to continue therapy and discussed medication side effects.  She is not interested to change the medication.  Continue Pamelor 75 mg at bedtime and Celexa 20 mg daily.  Recommended to call us back if she has any question, concern or if she feels worsening of the symptom.  Follow-up in 3 months.  Follow Up Instructions:    I discussed the assessment and treatment plan with the patient. The patient was provided an opportunity to ask questions and all were answered. The patient agreed with the plan and demonstrated an understanding of the instructions.   The patient was advised to call back or seek an in-person evaluation  if the symptoms worsen or if the condition fails to improve as anticipated.  I provided 12 minutes of non-face-to-face time during this encounter.   Cleotis Nipper, MD

## 2020-07-21 ENCOUNTER — Ambulatory Visit: Payer: Medicare HMO | Admitting: Certified Nurse Midwife

## 2020-07-24 DIAGNOSIS — E785 Hyperlipidemia, unspecified: Secondary | ICD-10-CM | POA: Diagnosis not present

## 2020-07-24 DIAGNOSIS — R030 Elevated blood-pressure reading, without diagnosis of hypertension: Secondary | ICD-10-CM | POA: Diagnosis not present

## 2020-08-01 ENCOUNTER — Other Ambulatory Visit: Payer: Self-pay | Admitting: Family Medicine

## 2020-08-01 DIAGNOSIS — Z1231 Encounter for screening mammogram for malignant neoplasm of breast: Secondary | ICD-10-CM

## 2020-08-28 ENCOUNTER — Ambulatory Visit
Admission: RE | Admit: 2020-08-28 | Discharge: 2020-08-28 | Disposition: A | Discharge status: Home / Self Care | Payer: Medicare HMO | Source: Ambulatory Visit | Attending: Family Medicine | Admitting: Family Medicine

## 2020-08-28 ENCOUNTER — Other Ambulatory Visit: Payer: Self-pay

## 2020-08-28 DIAGNOSIS — Z1231 Encounter for screening mammogram for malignant neoplasm of breast: Secondary | ICD-10-CM

## 2020-10-02 DIAGNOSIS — Z01812 Encounter for preprocedural laboratory examination: Secondary | ICD-10-CM | POA: Diagnosis not present

## 2020-10-07 DIAGNOSIS — Z800 Family history of malignant neoplasm of digestive organs: Secondary | ICD-10-CM | POA: Diagnosis not present

## 2020-10-08 IMAGING — MG DIGITAL SCREENING BILAT W/ TOMO W/ CAD
6 of 10 series · 6 of 30 positions shown · non-contrast
Comparison: Previous exam(s).

CLINICAL DATA: Screening.

EXAM:
DIGITAL SCREENING BILATERAL MAMMOGRAM WITH TOMO AND CAD

[R MLO synth-2D (1 of 2)]
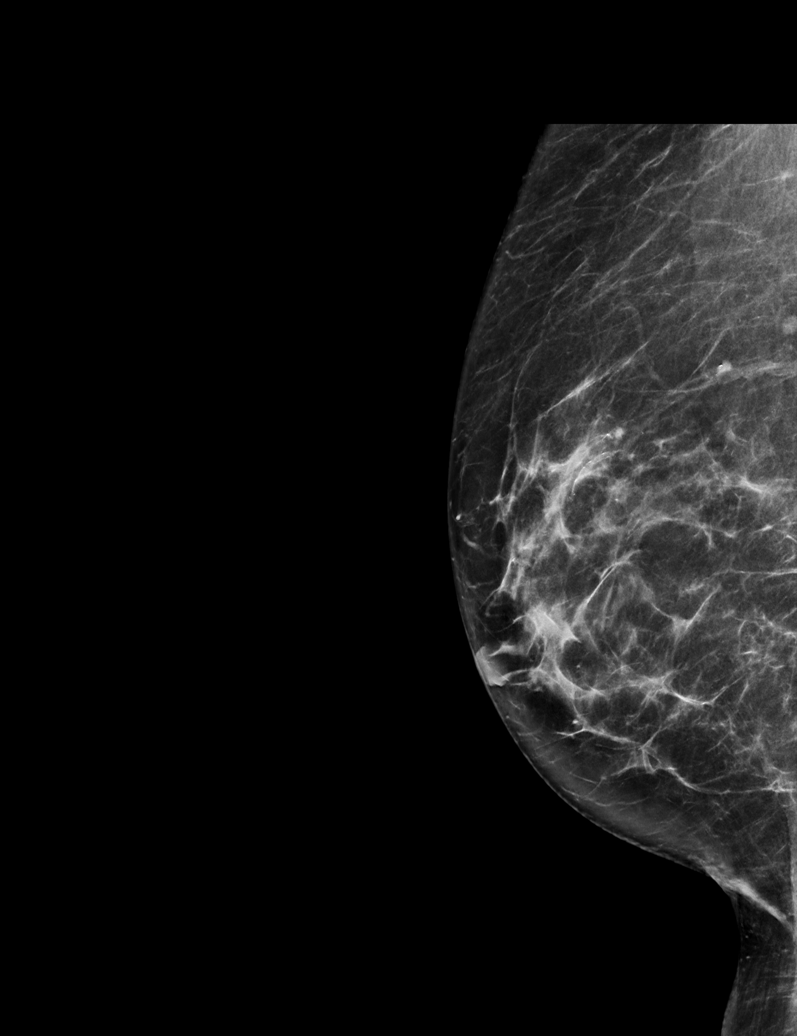

[L CC synth-2D]
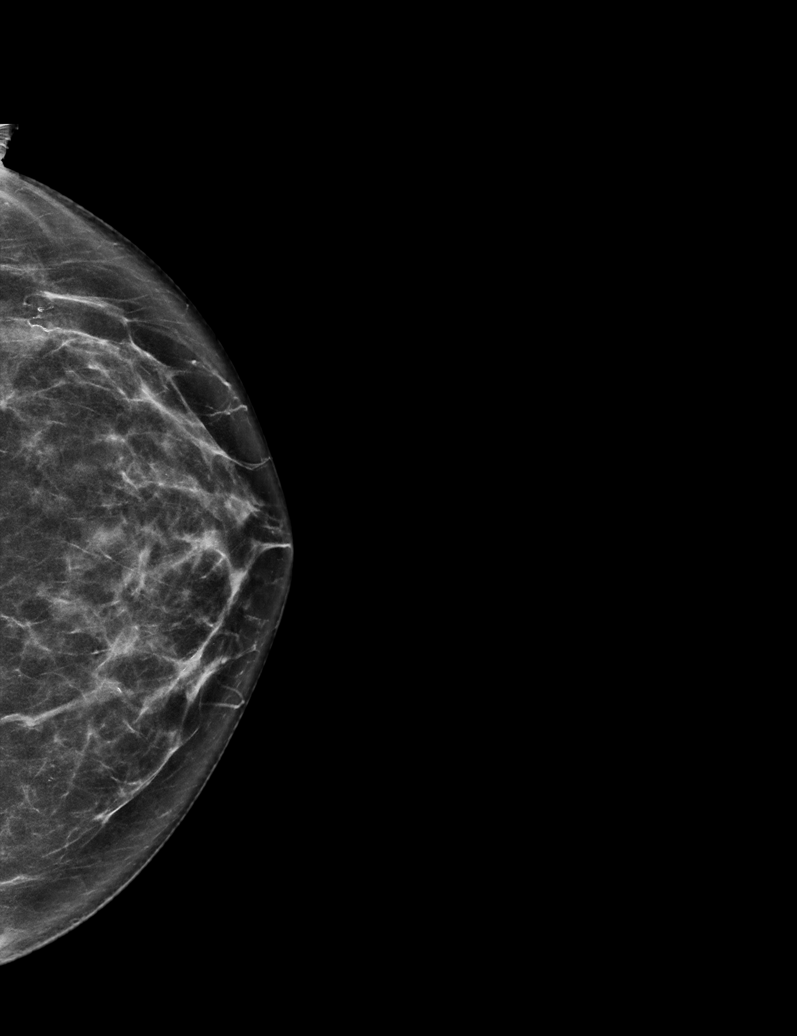

[R CC synth-2D]
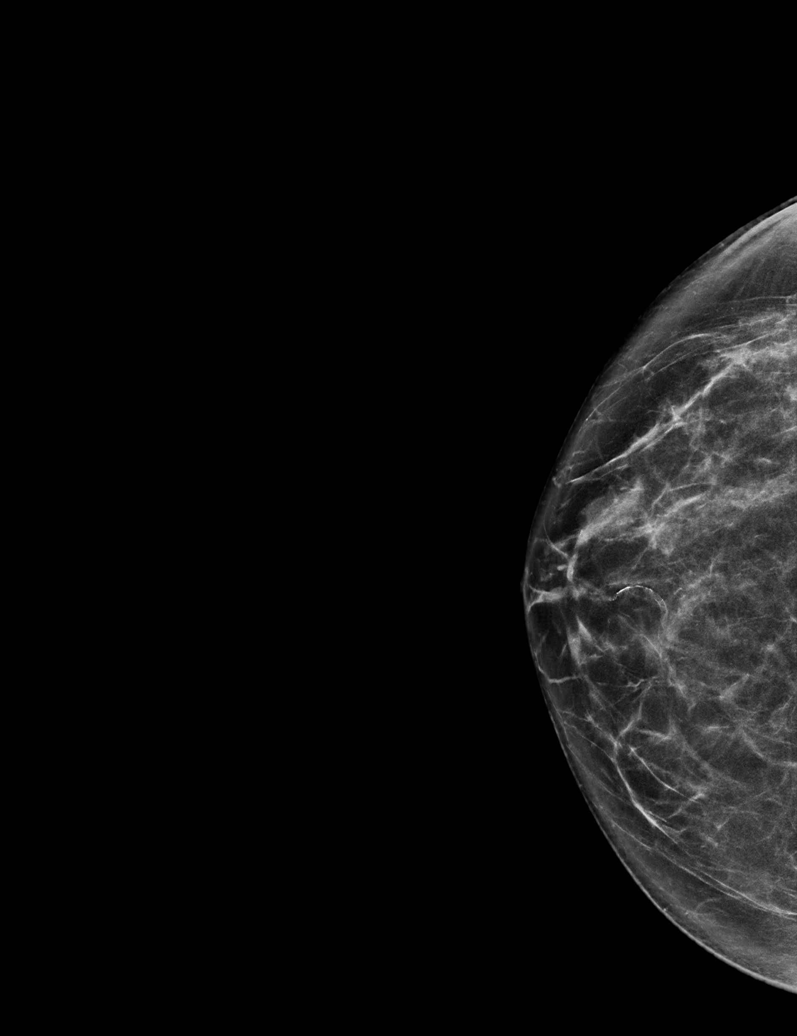

[R MLO synth-2D (2 of 2)]
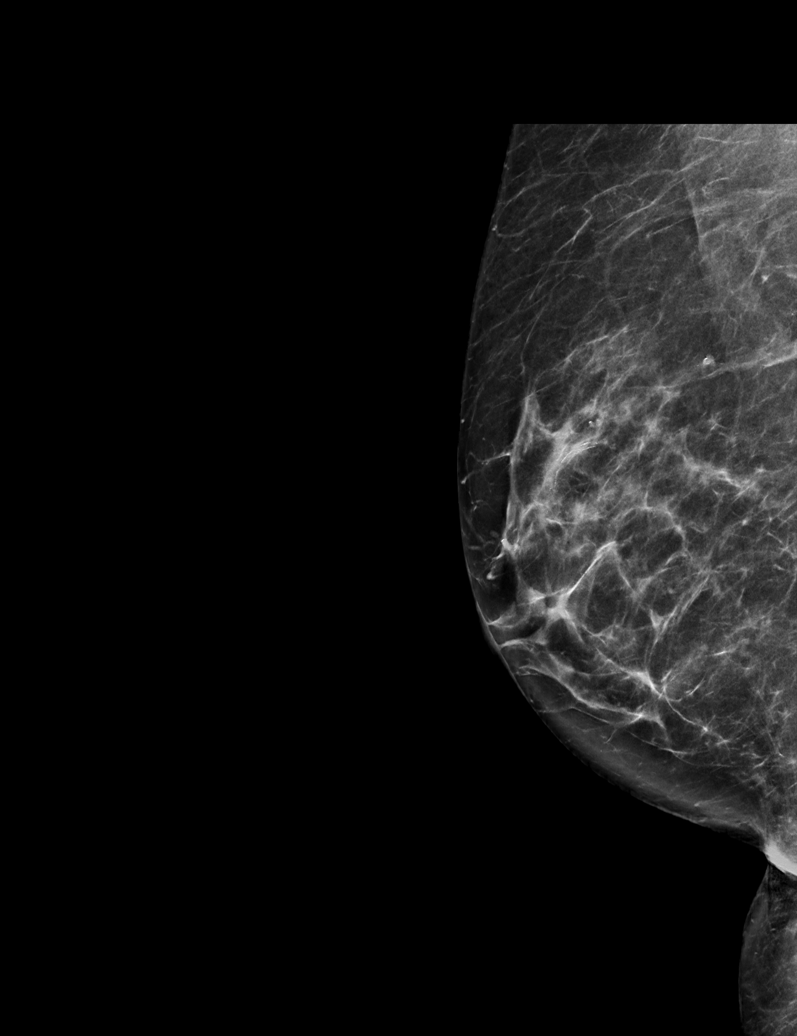

[L MLO synth-2D]
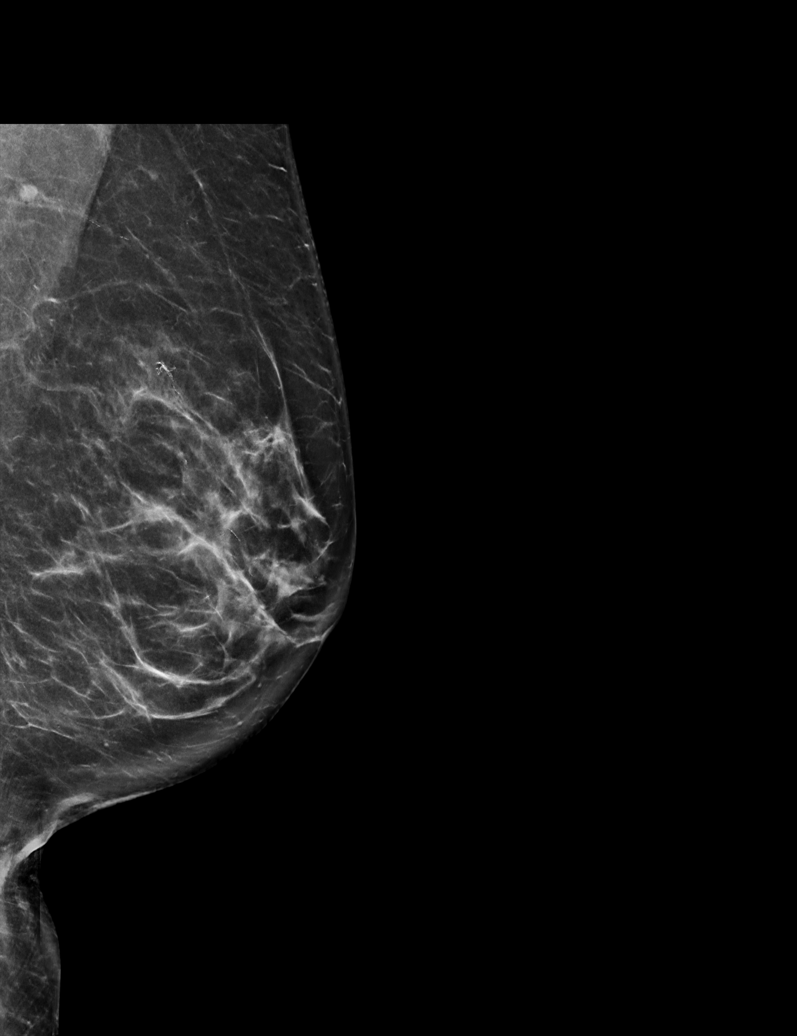

[R CC tomo · tomo slice 33/66.0]
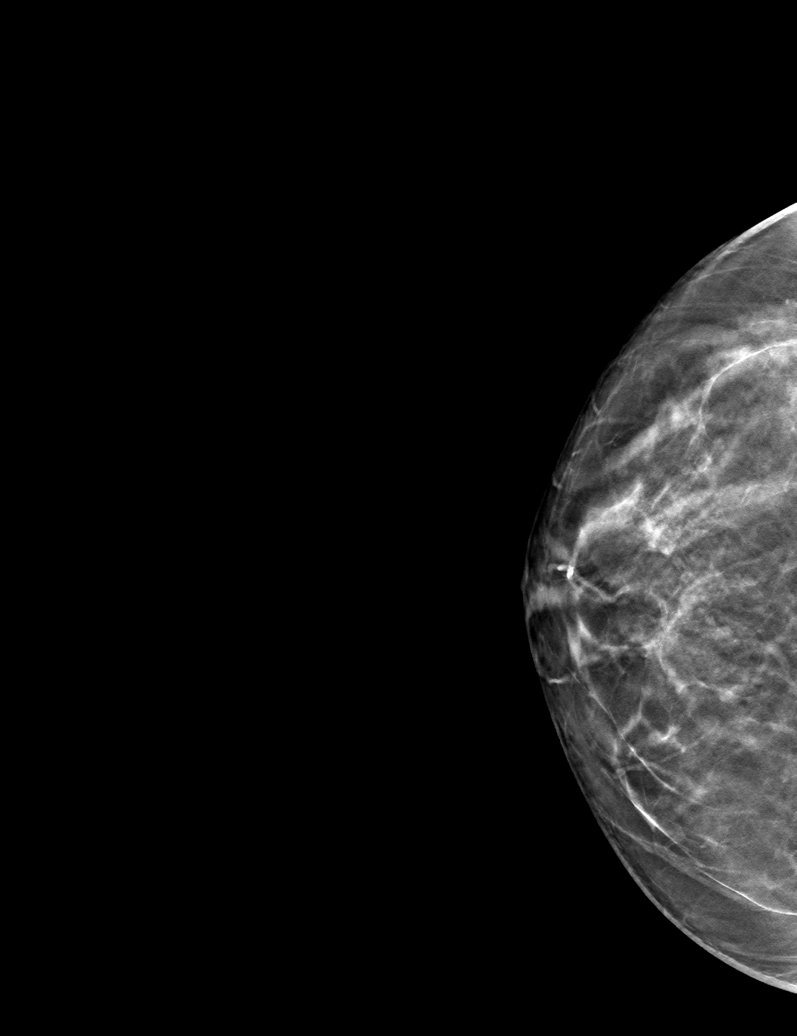

[6 of 30 positions shown; findings below may reference images not displayed]

ACR Breast Density Category c: The breast tissue is heterogeneously
dense, which may obscure small masses.
FINDINGS: There are no findings suspicious for malignancy. Images were
processed with CAD.
IMPRESSION: No mammographic evidence of malignancy. A result letter of this
screening mammogram will be mailed directly to the patient.

RECOMMENDATION:
Screening mammogram in one year. (Code:FT-U-LHB)

BI-RADS CATEGORY  1: Negative.

## 2020-10-09 ENCOUNTER — Telehealth (INDEPENDENT_AMBULATORY_CARE_PROVIDER_SITE_OTHER): Payer: Medicare HMO | Admitting: Psychiatry

## 2020-10-09 ENCOUNTER — Other Ambulatory Visit: Payer: Self-pay

## 2020-10-09 ENCOUNTER — Encounter (HOSPITAL_COMMUNITY): Payer: Self-pay | Admitting: Psychiatry

## 2020-10-09 DIAGNOSIS — F330 Major depressive disorder, recurrent, mild: Secondary | ICD-10-CM

## 2020-10-09 DIAGNOSIS — F419 Anxiety disorder, unspecified: Secondary | ICD-10-CM | POA: Diagnosis not present

## 2020-10-09 MED ORDER — NORTRIPTYLINE HCL 75 MG PO CAPS: 75.0000 mg | ORAL_CAPSULE | Freq: Every day | ORAL | 0 refills | Status: DC

## 2020-10-09 MED ORDER — CITALOPRAM HYDROBROMIDE 20 MG PO TABS: ORAL_TABLET | ORAL | 0 refills | Status: DC

## 2020-10-09 NOTE — Progress Notes (Signed)
Virtual Visit via Telephone Note  I connected with Anna Lindsey on 10/09/20 at  2:40 PM EDT by telephone and verified that I am speaking with the correct person using two identifiers.  Location: Patient: Home Provider: Home office   I discussed the limitations, risks, security and privacy concerns of performing an evaluation and management service by telephone and the availability of in person appointments. I also discussed with the patient that there may be a patient responsible charge related to this service. The patient expressed understanding and agreed to proceed.   History of Present Illness: Patient is evaluated by phone session.  She told past few weeks very difficult because her mother who is 18 years old and lives with her were screaming and calling her every night.  Now her mother's physician prescribed lorazepam which is working better and her mother does not call her frequently.  Patient told her mother does not qualify for nursing home and they cannot afford her either.  However she is getting hospice care twice a week to help her bathing and to help her ADLs.  Patient also talking to therapist who comes home once a week.  Patient does not want to change medication because she feels her sleep anxiety and depression is a stable on current medication.  Her appetite is okay.  She has a plan to have a Thanksgiving with her daughter who lives in [place].  Patient reported her energy level is okay.  Her weight is stable.  She has no tremors, shakes or any EPS.  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  Her husband is very supportive.  Past Psychiatric History; H/Odepressionsince1980s.No h/o mania, psychosis, suicidal attempt or inpatient. [place] and off. We triedLamictalbut stopped due torash.   Psychiatric Specialty Exam: Physical Exam  Review of Systems  Weight 104 lb (47.2 kg), last menstrual period 08/06/2000.There is no height or weight on  file to calculate BMI.  General Appearance: NA  Eye Contact:  NA  Speech:  Clear and Coherent  Volume:  Normal  Mood:  Euthymic  Affect:  NA  Thought Process:  Goal Directed  Orientation:  Full (Time, Place, and Person)  Thought Content:  WDL  Suicidal Thoughts:  No  Homicidal Thoughts:  No  Memory:  Immediate;   Good Recent;   Good Remote;   Good  Judgement:  Good  Insight:  Good  Psychomotor Activity:  NA  Concentration:  Concentration: Good and Attention Span: Good  Recall:  Good  Fund of Knowledge:  Good  Language:  Good  Akathisia:  No  Handed:  Right  AIMS (if indicated):     Assets:  Communication Skills Desire for Improvement Housing Resilience Social Support  ADL's:  Intact  Cognition:  WNL  Sleep:         Assessment and Plan: Major depressive disorder, recurrent.  Anxiety.  Patient has chronic anxiety which fluctuates as she is taking care of her elderly mother who is going to be 52 in coming April.  Patient does not want to change the medication.  She is not having any side effects.  We will continue nortriptyline 75 mg at bedtime and Celexa 20 mg daily.  Recommended to call us back if she is any question or any concern.  Follow-up in 3 months.  Follow Up Instructions:    I discussed the assessment and treatment plan with the patient. The patient was provided an opportunity to ask questions and all were answered. The patient  agreed with the plan and demonstrated an understanding of the instructions.   The patient was advised to call back or seek an in-person evaluation if the symptoms worsen or if the condition fails to improve as anticipated.  I provided 19 minutes of non-face-to-face time during this encounter.   Cleotis Nipper, MD

## 2020-10-23 DIAGNOSIS — E785 Hyperlipidemia, unspecified: Secondary | ICD-10-CM | POA: Diagnosis not present

## 2020-10-23 DIAGNOSIS — M797 Fibromyalgia: Secondary | ICD-10-CM | POA: Diagnosis not present

## 2020-10-23 DIAGNOSIS — F419 Anxiety disorder, unspecified: Secondary | ICD-10-CM | POA: Diagnosis not present

## 2021-01-07 ENCOUNTER — Telehealth (INDEPENDENT_AMBULATORY_CARE_PROVIDER_SITE_OTHER): Payer: Medicare PPO | Admitting: Psychiatry

## 2021-01-07 ENCOUNTER — Other Ambulatory Visit: Payer: Self-pay

## 2021-01-07 ENCOUNTER — Encounter (HOSPITAL_COMMUNITY): Payer: Self-pay | Admitting: Psychiatry

## 2021-01-07 VITALS — Wt 104.0 lb

## 2021-01-07 DIAGNOSIS — F419 Anxiety disorder, unspecified: Secondary | ICD-10-CM | POA: Diagnosis not present

## 2021-01-07 DIAGNOSIS — F330 Major depressive disorder, recurrent, mild: Secondary | ICD-10-CM | POA: Diagnosis not present

## 2021-01-07 DIAGNOSIS — F4321 Adjustment disorder with depressed mood: Secondary | ICD-10-CM

## 2021-01-07 MED ORDER — CITALOPRAM HYDROBROMIDE 20 MG PO TABS: ORAL_TABLET | ORAL | 0 refills | Status: DC

## 2021-01-07 MED ORDER — NORTRIPTYLINE HCL 75 MG PO CAPS: 75.0000 mg | ORAL_CAPSULE | Freq: Every day | ORAL | 0 refills | Status: DC

## 2021-01-07 NOTE — Progress Notes (Signed)
Virtual Visit via Telephone Note  I connected with Anna Lindsey on 01/07/21 at  2:40 PM EST by telephone and verified that I am speaking with the correct person using two identifiers.  Location: Patient: Home Provider: Home Office   I discussed the limitations, risks, security and privacy concerns of performing an evaluation and management service by telephone and the availability of in person appointments. I also discussed with the patient that there may be a patient responsible charge related to this service. The patient expressed understanding and agreed to proceed.   History of Present Illness: Patient is evaluated by phone session.  She is on the phone by herself.  Patient told on Christmas eve her mother passed away.  Patient told that she is having some time dysphoria because she misses her mother but also he says that she is in a better place.  Her mother was almost 33 years old.  She had a good support from her daughter and her husband.  She is sleeping good but sometime think about her mother.  She denies any crying spells, feeling of hopelessness or worthlessness.  Her appetite is okay and her weight is unchanged from the past.  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  She is taking the medication as prescribed.  Now she has to remove the belongings of her mother from her house so she can sell her mother's house.  She is hoping it will take 4 to 6 months.  She also received a call from hospice if she need grief counseling at that time she was not ready but now she is thinking to consider it.  Patient taking her medication and reported no side effects.  She denies any panic attacks.   Past Psychiatric History; H/Odepressionsince1980s.No h/o mania, psychosis, suicidal attempt or inpatient. [place] and [place]. We tried[place]ictalbut stopped due torash.  Psychiatric Specialty Exam: Physical Exam  Review of Systems  Weight 104 lb (47.2 kg), last  menstrual period 08/06/2000.There is no height or weight on file to calculate BMI.  General Appearance: NA  Eye Contact:  NA  Speech:  Clear and Coherent  Volume:  Normal  Mood:  Dysphoric  Affect:  NA  Thought Process:  Goal Directed  Orientation:  Full (Time, Place, and Person)  Thought Content:  WDL  Suicidal Thoughts:  No  Homicidal Thoughts:  No  Memory:  Immediate;   Good Recent;   Good Remote;   Good  Judgement:  Intact  Insight:  Present  Psychomotor Activity:  NA  Concentration:  Concentration: Good and Attention Span: Good  Recall:  Good  Fund of Knowledge:  Good  Language:  Good  Akathisia:  No  Handed:  Right  AIMS (if indicated):     Assets:  Communication Skills Desire for Improvement Housing Resilience Social Support Transportation  ADL's:  Intact  Cognition:  WNL  Sleep:   ok      Assessment and Plan: Major depressive disorder, recurrent.  Anxiety.  Grief.  Discuss her loss of mother and encouraged to consider grief counseling through hospice since they have approached her and at that time she was not ready.  She agree to follow-up with them.  Patient does not want to change the medication since it is working well.  Continue nortriptyline 75 mg at bedtime and Celexa 20 mg daily.  Recommended to call us back if there is any question or any concern.  Follow-up in 3 months.  Follow Up Instructions:  I discussed the assessment and treatment plan with the patient. The patient was provided an opportunity to ask questions and all were answered. The patient agreed with the plan and demonstrated an understanding of the instructions.   The patient was advised to call back or seek an in-person evaluation if the symptoms worsen or if the condition fails to improve as anticipated.  I provided 12 minutes of non-face-to-face time during this encounter.   Cleotis Nipper, MD

## 2021-04-02 ENCOUNTER — Other Ambulatory Visit: Payer: Self-pay

## 2021-04-02 ENCOUNTER — Telehealth (INDEPENDENT_AMBULATORY_CARE_PROVIDER_SITE_OTHER): Payer: Medicare PPO | Admitting: Psychiatry

## 2021-04-02 ENCOUNTER — Encounter (HOSPITAL_COMMUNITY): Payer: Self-pay | Admitting: Psychiatry

## 2021-04-02 DIAGNOSIS — F419 Anxiety disorder, unspecified: Secondary | ICD-10-CM | POA: Diagnosis not present

## 2021-04-02 DIAGNOSIS — F330 Major depressive disorder, recurrent, mild: Secondary | ICD-10-CM | POA: Diagnosis not present

## 2021-04-02 MED ORDER — NORTRIPTYLINE HCL 75 MG PO CAPS: 75.0000 mg | ORAL_CAPSULE | Freq: Every day | ORAL | 0 refills | Status: DC

## 2021-04-02 MED ORDER — CITALOPRAM HYDROBROMIDE 20 MG PO TABS: ORAL_TABLET | ORAL | 0 refills | Status: DC

## 2021-04-02 NOTE — Progress Notes (Signed)
Virtual Visit via Telephone Note  I connected with Anna Lindsey on 04/02/21 at  4:00 PM EDT by telephone and verified that I am speaking with the correct person using two identifiers.  Location: Patient: In Car Provider: Office   I discussed the limitations, risks, security and privacy concerns of performing an evaluation and management service by telephone and the availability of in person appointments. I also discussed with the patient that there may be a patient responsible charge related to this service. The patient expressed understanding and agreed to proceed.   History of Present Illness: Patient is evaluated by phone session.  She is in the car and no one is around her.  She feels the current medicine is working.  Occasionally she gets emotional when she thinks about her mother but she is able to come out from her emotions.  She loves animals and she has a lot of cats in her house.  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  She is sleeping good.  She denies any crying spells or any feeling of hopelessness.  After the death of the mother she was thinking to get grief counseling and she did try few sessions but now she is feeling better.  She does not feel that she needs to consider therapy at this time.  Her energy level is good.  She denies any feeling of hopelessness or any suicidal thoughts.  Her appetite is okay.  Recently she had back pain and diagnosed with sciatic pain. She is seeing a [facility] and hoping to get better soon.  She has no tremors shakes or any EPS.   Past Psychiatric History; H/Odepressionsince1980s.No h/o mania, psychosis, suicidal attempt or inpatient. [place] and [place]. We triedLamictalbut stopped due torash.  Psychiatric Specialty Exam: Physical Exam  Review of Systems  Weight 110 lb (49.9 kg), last menstrual period 08/06/2000.Body mass index is 21.48 kg/m.  General Appearance: NA  Eye Contact:  NA  Speech:   Clear and Coherent and Normal Rate  Volume:  Normal  Mood:  Euthymic  Affect:  NA  Thought Process:  Goal Directed  Orientation:  Full (Time, Place, and Person)  Thought Content:  WDL  Suicidal Thoughts:  No  Homicidal Thoughts:  No  Memory:  Immediate;   Good Recent;   Good Remote;   Good  Judgement:  Intact  Insight:  Present  Psychomotor Activity:  NA  Concentration:  Concentration: Good and Attention Span: Good  Recall:  Good  Fund of Knowledge:  Good  Language:  Good  Akathisia:  No  Handed:  Right  AIMS (if indicated):     Assets:  Communication Skills Desire for Improvement Housing Resilience Social Support Transportation  ADL's:  Intact  Cognition:  WNL  Sleep:   ok      Assessment and Plan: Major depressive disorder, recurrent.  Anxiety.  Patient doing better on her current medication.  She has no issues with the medication.  She is not interested in therapy.  Continue Celexa 20 mg daily and nortriptyline 75 mg at bedtime.  Discussed medication side effects and benefits.  Patient like to have a follow-up in person next time.  Recommended to call us back if she has any question or any concern.  Follow-up in 3 months.  Follow Up Instructions:    I discussed the assessment and treatment plan with the patient. The patient was provided an opportunity to ask questions and all were answered. The patient  agreed with the plan and demonstrated an understanding of the instructions.   The patient was advised to call back or seek an in-person evaluation if the symptoms worsen or if the condition fails to improve as anticipated.  I provided 13 minutes of non-face-to-face time during this encounter.   Cleotis Nipper, MD

## 2021-04-06 ENCOUNTER — Telehealth (HOSPITAL_COMMUNITY): Payer: Medicare PPO | Admitting: Psychiatry

## 2021-06-20 ENCOUNTER — Encounter (HOSPITAL_COMMUNITY): Payer: Self-pay

## 2021-06-20 ENCOUNTER — Ambulatory Visit (HOSPITAL_COMMUNITY)
Admission: EM | Admit: 2021-06-20 | Discharge: 2021-06-20 | Disposition: A | Discharge status: Home / Self Care | Payer: Medicare PPO | Attending: Student | Admitting: Student

## 2021-06-20 DIAGNOSIS — Z23 Encounter for immunization: Secondary | ICD-10-CM | POA: Diagnosis not present

## 2021-06-20 DIAGNOSIS — W5501XA Bitten by cat, initial encounter: Secondary | ICD-10-CM | POA: Diagnosis not present

## 2021-06-20 DIAGNOSIS — L03114 Cellulitis of left upper limb: Secondary | ICD-10-CM | POA: Diagnosis not present

## 2021-06-20 MED ORDER — AMOXICILLIN-POT CLAVULANATE 875-125 MG PO TABS: 1.0000 | ORAL_TABLET | Freq: Two times a day (BID) | ORAL | 0 refills | Status: DC

## 2021-06-20 MED ORDER — TETANUS-DIPHTH-ACELL PERTUSSIS 5-2.5-18.5 LF-MCG/0.5 IM SUSY
0.5000 mL | PREFILLED_SYRINGE | Freq: Once | INTRAMUSCULAR | Status: AC
  Administered 2021-06-20: 0.5 mL via INTRAMUSCULAR

## 2021-06-20 MED ORDER — TETANUS-DIPHTH-ACELL PERTUSSIS 5-2.5-18.5 LF-MCG/0.5 IM SUSY
PREFILLED_SYRINGE | INTRAMUSCULAR | Status: AC
  Filled 2021-06-20: qty 0.5

## 2021-06-20 NOTE — ED Triage Notes (Signed)
Pt presents with cat bite in the left wrist x 1 day. Pt reports pain and swelling in left wrist x 1 day. 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

## 2021-06-20 NOTE — ED Provider Notes (Signed)
[facility]    CSN: [id number] Arrival date & time: 06/20/21  1407      History   Chief Complaint Chief Complaint  Patient presents with   Animal Bite    HPI Anna Lindsey is a 75 y.o. female presenting with cat scratch to left arm 1 day ago, now with swelling.  Medical history fibromyalgia, anxiety.  States that she is fostering some kittens, one of them got scared yesterday and latched onto her arm with its claws.  Initially only with pain at the site of the scratch, but today she has pain traveling up her arm, with some redness and swelling as well.  Feeling well otherwise, denies fever/chills, discharge in the area.  States the cat is up-to-date on its immunizations.  HPI  Past Medical History:  Diagnosis Date   Anxiety    Broken ankle    crushed heel 2013   Condyloma    Depression    Dysplasia of cervix, low grade (CIN 1) 1995   LEEP   Fibromyalgia    Fractured rib    H/O: rheumatic fever    Hepatitis B    with complete resolution- documentation - [facility]    Patient Active Problem List   Diagnosis Date Noted   Depressive disorder, not elsewhere classified 01/16/2013    Past Surgical History:  Procedure Laterality Date   BLADDER SURGERY  1980   EXPLORATORY LAPAROTOMY     Due to bladder injury with fall of a horse   HYSTEROSCOPY  2004   D&C Polyp   LEEP     TONSILLECTOMY     childhood    OB History     Gravida  2   Para  1   Term  1   Preterm      AB  1   Living  1      SAB      IAB  1   Ectopic      Multiple      Live Births               Home Medications    Prior to Admission medications   Medication Sig Start Date End Date Taking? Authorizing Provider  amoxicillin-clavulanate (AUGMENTIN) 875-125 MG tablet Take 1 tablet by mouth every 12 (twelve) hours. 06/20/21  Yes Rhys Martini, PA-C  aspirin 81 MG tablet Take 81 mg by mouth daily.    [provider]  atorvastatin (LIPITOR) 40 MG tablet Take 40 mg by  mouth daily. 05/02/15   [provider]  Calcium Carbonate-Vitamin D (CALCIUM + D PO) Take 600 mg by mouth daily. + magnezium & zinc    [provider]  citalopram (CELEXA) 20 MG tablet TAKE 1 TABLET BY MOUTH ATBEDTIME. 04/02/21   Arfeen, Phillips Grout, MD  docusate sodium (COLACE) 100 MG capsule Take 1 capsule (100 mg total) by mouth 2 (two) times daily. Patient not taking: No sig reported 06/28/19   Helane Rima, DO  esomeprazole (NEXIUM) 40 MG capsule Take 1 capsule (40 mg total) by mouth daily. Patient not taking: Reported on 04/02/2021 06/28/19   Helane Rima, DO  Multiple Vitamin (MULTIVITAMIN) tablet Take 1 tablet by mouth daily.    [provider]  nortriptyline (PAMELOR) 75 MG capsule Take 1 capsule (75 mg total) by mouth at bedtime. 04/02/21   Arfeen, Phillips Grout, MD  polyethylene glycol powder (GLYCOLAX/MIRALAX) 17 GM/SCOOP powder Take 17 g by mouth 2 (two) times daily as needed.  06/28/19   Helane Rima, DO    Family History Family History  Problem Relation Age of Onset   Anxiety disorder Mother    Cancer Mother        skin   Thyroid disease Mother    Heart disease Mother        afib   Depression Father    Colon cancer Father    Hypertension Father    Depression Sister    Kidney disease Maternal Grandmother    Parkinson's disease Maternal Grandfather    Diabetes Paternal Grandmother    Heart disease Paternal Grandfather    Breast cancer Paternal Aunt     Social History Social History   Tobacco Use   Smoking status: Never   Smokeless tobacco: Never  Vaping Use   Vaping Use: Never used  Substance Use Topics   Alcohol use: No    Alcohol/week: 0.0 standard drinks   Drug use: No     Allergies   Ciprofloxacin and Hydrocodone   Review of Systems Review of Systems  Skin:  Positive for color change.  All other systems reviewed and are negative.   Physical Exam Triage Vital Signs ED Triage Vitals  Enc Vitals Group     BP      Pulse       Resp      Temp      Temp src      SpO2      Weight      Height      Head Circumference      Peak Flow      Pain Score      Pain Loc      Pain Edu?      Excl. in GC?    No data found.  Updated Vital Signs BP (!) 155/67 (BP Location: Right Arm)   Pulse 93   Temp 98.2 F (36.8 C) (Oral)   Resp 16   LMP 08/06/2000   SpO2 100%   Visual Acuity Right Eye Distance:   Left Eye Distance:   Bilateral Distance:    Right Eye Near:   Left Eye Near:    Bilateral Near:     Physical Exam Vitals reviewed.  Constitutional:      General: She is not in acute distress.    Appearance: Normal appearance. She is not ill-appearing or diaphoretic.  HENT:     Head: Normocephalic and atraumatic.  Cardiovascular:     Rate and Rhythm: Normal rate and regular rhythm.     Heart sounds: Normal heart sounds.  Pulmonary:     Effort: Pulmonary effort is normal.     Breath sounds: Normal breath sounds.  Skin:    General: Skin is warm.     Comments: See images below.  L arm with few puncture wounds to dorsal aspect, with surrounding erythema warmth and effusion. No discharge. ROM wrist intact but with pain. ROM elbow intact and without pain. Grip strength 5/5, sensation intact, cap refill <2 seconds, radial pulse 2+. No other wound.  Neurological:     General: No focal deficit present.     Mental Status: She is alert and oriented to person, place, and time.  Psychiatric:        Mood and Affect: Mood normal.        Behavior: Behavior normal.        Thought Content: Thought content normal.        Judgment: Judgment normal.  UC Treatments / Results  Labs (all labs ordered are listed, but only abnormal results are displayed) Labs Reviewed - No data to display  EKG   Radiology No results found.  Procedures Procedures (including critical care time)  Medications Ordered in UC Medications  Tdap (BOOSTRIX) injection 0.5 mL (has no administration in time range)     Initial Impression / Assessment and Plan / UC Course  I have reviewed the triage vital signs and the nursing notes.  Pertinent labs & imaging results that were available during my care of the patient were reviewed by me and considered in my medical decision making (see chart for details).     This patient is a very pleasant 75 y.o. year old female presenting with cellulitis from cat scratch that occurred 1 day ago. Afebrile, nontachy. Cat UTD on shots.  Augmentin as below. Tdap not UTD- administered today. Wound care discussed.  ED return precautions discussed. Patient verbalizes understanding and agreement.    Final Clinical Impressions(s) / UC Diagnoses   Final diagnoses:  Cat bite, initial encounter  Cellulitis of left arm  Need for Tdap vaccination     Discharge Instructions      -Start the antibiotic-Augmentin (amoxicillin-clavulanate), 1 pill every 12 hours for 7 days.  You can take this with food like with breakfast and dinner. Start taking this tonight, 1 pill tonight. -Mark the redness on your arm with a permanent marker or similar. Keep a close eye on this. After 24 hours of antibiotic, the redness should stop growing. It the redness gets >1 inch bigger after you've been on the antibiotic for 24 hours, head to the ED for possible IV antibiotics.  -Tylenol for pain -Seek additional medical attention if symptoms worsen or persist, like worsening of the redness, new fever/chills, increasing of the swelling in your arm, etc. -Wash your wound with gentle soap and water 1-2 times daily.  Let air dry or gently pat.  Follow with Neosporin ointment or similar.  Cover open wounds with bandage.  Avoid hydrogen peroxide or alcohol to cleanse.     ED Prescriptions     Medication Sig Dispense Auth. Provider   amoxicillin-clavulanate (AUGMENTIN) 875-125 MG tablet Take 1 tablet by mouth every 12 (twelve) hours. 14 tablet Rhys Martini, PA-C      PDMP not reviewed this  encounter.   Rhys Martini, PA-C 06/20/21 1642

## 2021-06-20 NOTE — Discharge Instructions (Addendum)
-  Start the antibiotic-Augmentin (amoxicillin-clavulanate), 1 pill every 12 hours for 7 days.  You can take this with food like with breakfast and dinner. Start taking this tonight, 1 pill tonight. -Mark the redness on your arm with a permanent marker or similar. Keep a close eye on this. After 24 hours of antibiotic, the redness should stop growing. It the redness gets >1 inch bigger after you've been on the antibiotic for 24 hours, head to the ED for possible IV antibiotics.  -Tylenol for pain -Seek additional medical attention if symptoms worsen or persist, like worsening of the redness, new fever/chills, increasing of the swelling in your arm, etc. -Wash your wound with gentle soap and water 1-2 times daily.  Let air dry or gently pat.  Follow with Neosporin ointment or similar.  Cover open wounds with bandage.  Avoid hydrogen peroxide or alcohol to cleanse.

## 2021-06-21 ENCOUNTER — Emergency Department (HOSPITAL_COMMUNITY): Payer: Medicare PPO

## 2021-06-21 ENCOUNTER — Other Ambulatory Visit: Payer: Self-pay

## 2021-06-21 ENCOUNTER — Encounter (HOSPITAL_COMMUNITY): Payer: Self-pay

## 2021-06-21 ENCOUNTER — Inpatient Hospital Stay (HOSPITAL_COMMUNITY)
Admission: EM | Admit: 2021-06-21 | Discharge: 2021-06-23 | DRG: 603 | Disposition: A | Discharge status: Home / Self Care | Payer: Medicare PPO | Attending: Internal Medicine | Admitting: Internal Medicine

## 2021-06-21 DIAGNOSIS — Z820 Family history of epilepsy and other diseases of the nervous system: Secondary | ICD-10-CM

## 2021-06-21 DIAGNOSIS — Z800 Family history of malignant neoplasm of digestive organs: Secondary | ICD-10-CM | POA: Diagnosis not present

## 2021-06-21 DIAGNOSIS — Z833 Family history of diabetes mellitus: Secondary | ICD-10-CM

## 2021-06-21 DIAGNOSIS — Z79899 Other long term (current) drug therapy: Secondary | ICD-10-CM

## 2021-06-21 DIAGNOSIS — E876 Hypokalemia: Secondary | ICD-10-CM | POA: Diagnosis not present

## 2021-06-21 DIAGNOSIS — F32A Depression, unspecified: Secondary | ICD-10-CM | POA: Diagnosis present

## 2021-06-21 DIAGNOSIS — Z885 Allergy status to narcotic agent status: Secondary | ICD-10-CM | POA: Diagnosis not present

## 2021-06-21 DIAGNOSIS — K219 Gastro-esophageal reflux disease without esophagitis: Secondary | ICD-10-CM | POA: Diagnosis present

## 2021-06-21 DIAGNOSIS — Z881 Allergy status to other antibiotic agents status: Secondary | ICD-10-CM

## 2021-06-21 DIAGNOSIS — Z20822 Contact with and (suspected) exposure to covid-19: Secondary | ICD-10-CM | POA: Diagnosis present

## 2021-06-21 DIAGNOSIS — F419 Anxiety disorder, unspecified: Secondary | ICD-10-CM | POA: Diagnosis present

## 2021-06-21 DIAGNOSIS — W5501XA Bitten by cat, initial encounter: Secondary | ICD-10-CM

## 2021-06-21 DIAGNOSIS — Z803 Family history of malignant neoplasm of breast: Secondary | ICD-10-CM | POA: Diagnosis not present

## 2021-06-21 DIAGNOSIS — L03114 Cellulitis of left upper limb: Secondary | ICD-10-CM | POA: Diagnosis present

## 2021-06-21 DIAGNOSIS — E871 Hypo-osmolality and hyponatremia: Secondary | ICD-10-CM | POA: Diagnosis not present

## 2021-06-21 DIAGNOSIS — Z818 Family history of other mental and behavioral disorders: Secondary | ICD-10-CM | POA: Diagnosis not present

## 2021-06-21 DIAGNOSIS — E785 Hyperlipidemia, unspecified: Secondary | ICD-10-CM | POA: Diagnosis present

## 2021-06-21 DIAGNOSIS — Z8249 Family history of ischemic heart disease and other diseases of the circulatory system: Secondary | ICD-10-CM | POA: Diagnosis not present

## 2021-06-21 DIAGNOSIS — Z7982 Long term (current) use of aspirin: Secondary | ICD-10-CM

## 2021-06-21 DIAGNOSIS — Z8349 Family history of other endocrine, nutritional and metabolic diseases: Secondary | ICD-10-CM

## 2021-06-21 DIAGNOSIS — M797 Fibromyalgia: Secondary | ICD-10-CM | POA: Diagnosis present

## 2021-06-21 LAB — BASIC METABOLIC PANEL
Anion gap: 9 (ref 5–15)
BUN: 8 mg/dL (ref 8–23)
CO2: 26 mmol/L (ref 22–32)
Calcium: 9.5 mg/dL (ref 8.9–10.3)
Chloride: 100 mmol/L (ref 98–111)
Creatinine, Ser: 0.42 mg/dL — ABNORMAL LOW (ref 0.44–1.00)
GFR, Estimated: >60 mL/min (ref >60)
Glucose, Bld: 89 mg/dL (ref 70–99)
Potassium: 4.2 mmol/L (ref 3.5–5.1)
Sodium: 135 mmol/L (ref 135–145)

## 2021-06-21 LAB — RESP PANEL BY RT-PCR (FLU A&B, COVID) ARPGX2
Influenza A by PCR: NEGATIVE
Influenza B by PCR: NEGATIVE
SARS Coronavirus 2 by RT PCR: NEGATIVE

## 2021-06-21 LAB — CBC
HCT: 36.3 % (ref 36.0–46.0)
Hemoglobin: 12.2 g/dL (ref 12.0–15.0)
MCH: 31.2 pg (ref 26.0–34.0)
MCHC: 33.6 g/dL (ref 30.0–36.0)
MCV: 92.8 fL (ref 80.0–100.0)
Platelets: 310 10*3/uL (ref 150–400)
RBC: 3.91 MIL/uL (ref 3.87–5.11)
RDW: 12.8 % (ref 11.5–15.5)
WBC: 10.2 10*3/uL (ref 4.0–10.5)
nRBC: 0 % (ref 0.0–0.2)

## 2021-06-21 MED ORDER — KETOROLAC TROMETHAMINE 15 MG/ML IJ SOLN: 15.0000 mg | Freq: Four times a day (QID) | INTRAMUSCULAR | Status: DC | PRN

## 2021-06-21 MED ORDER — ONDANSETRON HCL 4 MG PO TABS: 4.0000 mg | ORAL_TABLET | Freq: Four times a day (QID) | ORAL | Status: DC | PRN

## 2021-06-21 MED ORDER — ACETAMINOPHEN 650 MG RE SUPP: 650.0000 mg | Freq: Four times a day (QID) | RECTAL | Status: DC | PRN

## 2021-06-21 MED ORDER — SODIUM CHLORIDE 0.45 % IV SOLN: INTRAVENOUS | Status: DC

## 2021-06-21 MED ORDER — ONDANSETRON HCL 4 MG/2ML IJ SOLN: 4.0000 mg | Freq: Four times a day (QID) | INTRAMUSCULAR | Status: DC | PRN

## 2021-06-21 MED ORDER — SODIUM CHLORIDE 0.9 % IV SOLN
3.0000 g | Freq: Four times a day (QID) | INTRAVENOUS | Status: DC
  Administered 2021-06-22 – 2021-06-23 (×7): 3 g via INTRAVENOUS
  Filled 2021-06-21 (×9): qty 8

## 2021-06-21 MED ORDER — ENOXAPARIN SODIUM 40 MG/0.4ML IJ SOSY
40.0000 mg | PREFILLED_SYRINGE | INTRAMUSCULAR | Status: DC
  Administered 2021-06-22 (×2): 40 mg via SUBCUTANEOUS
  Filled 2021-06-21 (×2): qty 0.4

## 2021-06-21 MED ORDER — SODIUM CHLORIDE 0.9 % IV SOLN: 3.0000 g | Freq: Four times a day (QID) | INTRAVENOUS | Status: DC

## 2021-06-21 MED ORDER — SODIUM CHLORIDE 0.9 % IV SOLN
1.5000 g | Freq: Once | INTRAVENOUS | Status: AC
  Administered 2021-06-21: 1.5 g via INTRAVENOUS
  Filled 2021-06-21: qty 4

## 2021-06-21 MED ORDER — ACETAMINOPHEN 325 MG PO TABS
650.0000 mg | ORAL_TABLET | Freq: Four times a day (QID) | ORAL | Status: DC | PRN
  Administered 2021-06-22 – 2021-06-23 (×4): 650 mg via ORAL
  Filled 2021-06-21 (×4): qty 2

## 2021-06-21 NOTE — H&P (Signed)
History and Physical   Anna Lindsey:[id number] DOB: July 15, 1946 DOA: 06/21/2021  Referring MD/NP/PA: Dr. Rosalia Hammers  PCP: Patient, No Pcp Per (Inactive)   Outpatient Specialists: None  Patient coming from: Home  Chief Complaint: Left arm swelling  HPI: Anna Lindsey is a 75 y.o. female with medical history significant of depression with anxiety, fibromyalgia, hepatitis B who was bitten by her cat at home.  She was bitten at the left forearm.  Noted immediate swelling redness and pain.  Patient went to urgent care center where she was started on Augmentin yesterday.  She noticed worsening of the swelling and the streaking of the arm.  She therefore decided to come in to the ER for further evaluation and treatment.  She denied any fever but has some mild chills.  No nausea vomiting or diarrhea.  Patient's arm was noted to have swelling with streaking.  At this point she is being admitted to the hospital for evaluation and treatment.  Surgery has been consulted to assist with treatment..  ED Course: Temperature 98.4 blood pressure 150/82, pulse 88, respiratory of 20 and oxygen sats 96% on room air.  CBC and chemistry all within normal.  Influenza and COVID-19 screen negative.  X-ray of the left forearm showed mild tissue swelling otherwise no significant findings.  Patient being admitted for further evaluation and treatment  Review of Systems: As per HPI otherwise 10 point review of systems negative.    Past Medical History:  Diagnosis Date   Anxiety    Broken ankle    crushed heel 2013   Condyloma    Depression    Dysplasia of cervix, low grade (CIN 1) 1995   LEEP   Fibromyalgia    Fractured rib    H/O: rheumatic fever    Hepatitis B    with complete resolution- documentation - [facility]    Past Surgical History:  Procedure Laterality Date   BLADDER SURGERY  1980   EXPLORATORY LAPAROTOMY     Due to bladder injury with fall of a horse   HYSTEROSCOPY  2004   D&C Polyp   LEEP      TONSILLECTOMY     childhood     reports that she has never smoked. She has never used smokeless tobacco. She reports that she does not drink alcohol and does not use drugs.  Allergies  Allergen Reactions   Ciprofloxacin Anxiety and Anaphylaxis   Hydrocodone Nausea Only    Family History  Problem Relation Age of Onset   Anxiety disorder Mother    Cancer Mother        skin   Thyroid disease Mother    Heart disease Mother        afib   Depression Father    Colon cancer Father    Hypertension Father    Depression Sister    Kidney disease Maternal Grandmother    Parkinson's disease Maternal Grandfather    Diabetes Paternal Grandmother    Heart disease Paternal Grandfather    Breast cancer Paternal Aunt      Prior to Admission medications   Medication Sig Start Date End Date Taking? Authorizing Provider  amoxicillin-clavulanate (AUGMENTIN) 875-125 MG tablet Take 1 tablet by mouth every 12 (twelve) hours. 06/20/21   Rhys Martini, PA-C  aspirin 81 MG tablet Take 81 mg by mouth daily.    [provider]  atorvastatin (LIPITOR) 40 MG tablet Take 40 mg by mouth daily. 05/02/15   [provider]  Calcium Carbonate-Vitamin D (CALCIUM + D PO) Take 600 mg by mouth daily. + magnezium & zinc    [provider]  citalopram (CELEXA) 20 MG tablet TAKE 1 TABLET BY MOUTH ATBEDTIME. 04/02/21   Arfeen, Phillips Grout, MD  docusate sodium (COLACE) 100 MG capsule Take 1 capsule (100 mg total) by mouth 2 (two) times daily. Patient not taking: No sig reported 06/28/19   Helane Rima, DO  esomeprazole (NEXIUM) 40 MG capsule Take 1 capsule (40 mg total) by mouth daily. Patient not taking: Reported on 04/02/2021 06/28/19   Helane Rima, DO  Multiple Vitamin (MULTIVITAMIN) tablet Take 1 tablet by mouth daily.    [provider]  nortriptyline (PAMELOR) 75 MG capsule Take 1 capsule (75 mg total) by mouth at bedtime. 04/02/21   Arfeen, Phillips Grout, MD  polyethylene glycol powder  (GLYCOLAX/MIRALAX) 17 GM/SCOOP powder Take 17 g by mouth 2 (two) times daily as needed. 06/28/19   Helane Rima, DO    Physical Exam: Vitals:   06/21/21 1955 06/21/21 2100 06/21/21 2130  BP: (!) 158/82 (!) 142/69 (!) 152/68  Pulse: 88 74 76  Resp: 15 18 20   Temp: 98.1 F (36.7 C)    TempSrc: Oral    SpO2: 97% 96% 97%  Weight: 48.5 kg    Height: 5\' 2"  (1.575 m)        Constitutional: Acutely ill looking, anxious, no distress Vitals:   06/21/21 1955 06/21/21 2100 06/21/21 2130  BP: (!) 158/82 (!) 142/69 (!) 152/68  Pulse: 88 74 76  Resp: 15 18 20   Temp: 98.1 F (36.7 C)    TempSrc: Oral    SpO2: 97% 96% 97%  Weight: 48.5 kg    Height: 5\' 2"  (1.575 m)     Eyes: PERRL, lids and conjunctivae normal ENMT: Mucous membranes are moist. Posterior pharynx clear of any exudate or lesions.Normal dentition.  Neck: normal, supple, no masses, no thyromegaly Respiratory: clear to auscultation bilaterally, no wheezing, no crackles. Normal respiratory effort. No accessory muscle use.  Cardiovascular: Regular rate and rhythm, no murmurs / rubs / gallops. No extremity edema. 2+ pedal pulses. No carotid bruits.  Abdomen: no tenderness, no masses palpated. No hepatosplenomegaly. Bowel sounds positive.  Musculoskeletal: no clubbing / cyanosis. No joint deformity upper and lower extremities. Good ROM, no contractures. Normal muscle tone.  Skin: Left forearm with bite marks, swollen, red, no obvious abscess  neurologic: CN 2-12 grossly intact. Sensation intact, DTR normal. Strength 5/5 in all 4.  Psychiatric: Normal judgment and insight. Alert and oriented x 3. Normal mood.     Labs on Admission: I have personally reviewed following labs and imaging studies  CBC: Recent Labs  Lab 06/21/21 2042  WBC 10.2  HGB 12.2  HCT 36.3  MCV 92.8  PLT 310   Basic Metabolic Panel: Recent Labs  Lab 06/21/21 2042  NA 135  K 4.2  CL 100  CO2 26  GLUCOSE 89  BUN 8  CREATININE 0.42*  CALCIUM  9.5   GFR: Estimated Creatinine Clearance: 47.2 mL/min (A) (by C-G formula based on SCr of 0.42 mg/dL (L)). Liver Function Tests: No results for input(s): AST, ALT, ALKPHOS, BILITOT, PROT, ALBUMIN in the last 168 hours. No results for input(s): LIPASE, AMYLASE in the last 168 hours. No results for input(s): AMMONIA in the last 168 hours. Coagulation Profile: No results for input(s): INR, PROTIME in the last 168 hours. Cardiac Enzymes: No results for input(s): CKTOTAL, CKMB, CKMBINDEX, TROPONINI in the last 168  hours. BNP (last 3 results) No results for input(s): PROBNP in the last 8760 hours. HbA1C: No results for input(s): HGBA1C in the last 72 hours. CBG: No results for input(s): GLUCAP in the last 168 hours. Lipid Profile: No results for input(s): CHOL, HDL, LDLCALC, TRIG, CHOLHDL, LDLDIRECT in the last 72 hours. Thyroid Function Tests: No results for input(s): TSH, T4TOTAL, FREET4, T3FREE, THYROIDAB in the last 72 hours. Anemia Panel: No results for input(s): VITAMINB12, FOLATE, FERRITIN, TIBC, IRON, RETICCTPCT in the last 72 hours. Urine analysis:    Component Value Date/Time   BILIRUBINUR negative 07/21/2016 1457   KETONESUR negative 07/21/2016 1457   PROTEINUR negative 07/21/2016 1457   UROBILINOGEN 0.2 07/21/2016 1457   NITRITE Negative 07/21/2016 1457   LEUKOCYTESUR Negative 07/21/2016 1457   Sepsis Labs: @LABRCNTIP (procalcitonin:4,lacticidven:4) )No results found for this or any previous visit (from the past 240 hour(s)).   Radiological Exams on Admission: DG Forearm Left  Result Date: 06/21/2021 CLINICAL DATA:  Cat bite EXAM: LEFT FOREARM - 2 VIEW COMPARISON:  None. FINDINGS: No fracture or dislocation is seen. The joint spaces are preserved. Mild soft tissue swelling along the lateral/dorsal aspect of the distal radial shaft. No radiopaque foreign body is seen. IMPRESSION: Mild soft tissue swelling along the distal radial shaft. No fracture, dislocation, or  radiopaque foreign body is seen. Electronically Signed   By: 06/23/2021 M.D.   On: 06/21/2021 20:58      Assessment/Plan Principal Problem:   Cellulitis of left hand Active Problems:   Depression   GERD (gastroesophageal reflux disease)   Hyperlipemia     #1 cellulitis of the left forearm: Secondary to cat bite.  Left forearm is swollen.  Mildly tender.  Patient will be admitted and initiated on IV Unasyn.  Surgery already consulted.  We will continue with blood cultures.  Depend on the outcome will determine oral antibiotic therapy prior to discharge.  #2 GERD: Continue with PPIs  #3 depression: Confirm home regimen and resume  #4 hyperlipidemia: Confirm home regimen and resume   DVT prophylaxis: Lovenox Code Status: Full code Family Communication: No family at bedside Disposition Plan: Home Consults called: Dr. 06/23/2021, general surgery Admission status: Inpatient  Severity of Illness: The appropriate patient status for this patient is INPATIENT. Inpatient status is judged to be reasonable and necessary in order to provide the required intensity of service to ensure the patient's safety. The patient's presenting symptoms, physical exam findings, and initial radiographic and laboratory data in the context of their chronic comorbidities is felt to place them at high risk for further clinical deterioration. Furthermore, it is not anticipated that the patient will be medically stable for discharge from the hospital within 2 midnights of admission. The following factors support the patient status of inpatient.   " The patient's presenting symptoms include swollen left arm swelling rate and raise. " The worrisome physical exam findings include swollen left forearm. " The initial radiographic and laboratory data are worrisome because of no evidence of abscess. " The chronic co-morbidities include hypertension.   * I certify that at the point of admission it is my clinical  judgment that the patient will require inpatient hospital care spanning beyond 2 midnights from the point of admission due to high intensity of service, high risk for further deterioration and high frequency of surveillance required.Magnus Ivan MD [facility] Pager [phone]  If 7PM-7AM, please contact night-coverage [email or web address] Password [email or web address]  06/21/2021, 10:20 PM

## 2021-06-21 NOTE — ED Triage Notes (Signed)
Pt reports being bit by her foster cat Friday morning. Pt went to UC yesterday and was given Augmentin. The redness around the wound as spread since then.

## 2021-06-21 NOTE — ED Provider Notes (Signed)
[facility]-EMERGENCY DEPT Provider Note   CSN: [id number] Arrival date & time: 06/21/21  1951     History Chief Complaint  Patient presents with   Animal Bite    Anna Lindsey is a 75 y.o. female.  HPI   75 year old female presents today complaining of worsening redness and swelling of her left forearm after cat bite on Friday.  She had a bite to the left forearm and scratches to her hand.  She was seen at urgent care yesterday and started on Augmentin.  At that time, she had some cellulitis and there was a line drawn around it.  The redness is  now extending outside the line proximally and onto the volar surface of the arm.  She denies streaking above the elbow, fever, or chills.  Past Medical History:  Diagnosis Date   Anxiety    Broken ankle    crushed heel 2013   Condyloma    Depression    Dysplasia of cervix, low grade (CIN 1) 1995   LEEP   Fibromyalgia    Fractured rib    H/O: rheumatic fever    Hepatitis B    with complete resolution- documentation - [facility]    Patient Active Problem List   Diagnosis Date Noted   Depressive disorder, not elsewhere classified 01/16/2013    Past Surgical History:  Procedure Laterality Date   BLADDER SURGERY  1980   EXPLORATORY LAPAROTOMY     Due to bladder injury with fall of a horse   HYSTEROSCOPY  2004   D&C [facility]yp   LEEP     TONSILLECTOMY     childhood     OB History     Gravida  2   Para  1   Term  1   Preterm      AB  1   Living  1      SAB      IAB  1   Ectopic      Multiple      Live Births              Family History  Problem Relation Age of Onset   Anxiety disorder Mother    Cancer Mother        skin   Thyroid disease Mother    Heart disease Mother        afib   Depression Father    Colon cancer Father    Hypertension Father    Depression Sister    Kidney disease Maternal Grandmother    Parkinson's disease Maternal Grandfather    Diabetes Paternal  Grandmother    Heart disease Paternal Grandfather    Breast cancer Paternal Aunt     Social History   Tobacco Use   Smoking status: Never   Smokeless tobacco: Never  Vaping Use   Vaping Use: Never used  Substance Use Topics   Alcohol use: No    Alcohol/week: 0.0 standard drinks   Drug use: No    Home Medications Prior to Admission medications   Medication Sig Start Date End Date Taking? Authorizing Provider  amoxicillin-clavulanate (AUGMENTIN) 875-125 MG tablet Take 1 tablet by mouth every 12 (twelve) hours. 06/20/21   Rhys Martini, PA-C  aspirin 81 MG tablet Take 81 mg by mouth daily.    [provider]  atorvastatin (LIPITOR) 40 MG tablet Take 40 mg by mouth daily. 05/02/15   [provider]  Calcium Carbonate-Vitamin D (CALCIUM + D PO)  Take 600 mg by mouth daily. + magnezium & zinc    [provider]  citalopram (CELEXA) 20 MG tablet TAKE 1 TABLET BY MOUTH ATBEDTIME. 04/02/21   Arfeen, Phillips Grout, MD  docusate sodium (COLACE) 100 MG capsule Take 1 capsule (100 mg total) by mouth 2 (two) times daily. Patient not taking: No sig reported 06/28/19   Helane Rima, DO  esomeprazole (NEXIUM) 40 MG capsule Take 1 capsule (40 mg total) by mouth daily. Patient not taking: Reported on 04/02/2021 06/28/19   Helane Rima, DO  Multiple Vitamin (MULTIVITAMIN) tablet Take 1 tablet by mouth daily.    [provider]  nortriptyline (PAMELOR) 75 MG capsule Take 1 capsule (75 mg total) by mouth at bedtime. 04/02/21   Arfeen, Phillips Grout, MD  polyethylene glycol powder (GLYCOLAX/MIRALAX) 17 GM/SCOOP powder Take 17 g by mouth 2 (two) times daily as needed. 06/28/19   Helane Rima, DO    Allergies    Ciprofloxacin and Hydrocodone  Review of Systems   Review of Systems  All other systems reviewed and are negative.  Physical Exam Updated Vital Signs BP (!) 142/69   Pulse 74   Temp 98.1 F (36.7 C) (Oral)   Resp 18   Ht 1.575 m (5\' 2" )   Wt 48.5 kg   LMP  08/06/2000   SpO2 96%   BMI 19.57 kg/m   Physical Exam Vitals reviewed.  Constitutional:      Appearance: Normal appearance.  HENT:     Head: Normocephalic.     Right Ear: External ear normal.     Left Ear: External ear normal.     Nose: Nose normal.     Mouth/Throat:     Mouth: Mucous membranes are moist.  Eyes:     Pupils: Pupils are equal, round, and reactive to light.  Cardiovascular:     Rate and Rhythm: Normal rate and regular rhythm.  Pulmonary:     Effort: Pulmonary effort is normal.  Abdominal:     General: Abdomen is flat.     Palpations: Abdomen is soft.  Musculoskeletal:     Cervical back: Normal range of motion.     Comments: Left forearm with puncture wound and erythema from dorsum onto volar aspect from wrist proximally to elbow There are some abrasions and discoloration on the hand There is no fluctuance or discharge noted  Neurological:     Mental Status: She is alert.    ED Results / Procedures / Treatments   Labs (all labs ordered are listed, but only abnormal results are displayed) Labs Reviewed  CULTURE, BLOOD (SINGLE)  CBC  BASIC METABOLIC PANEL    EKG None  Radiology DG Forearm Left  Result Date: 06/21/2021 CLINICAL DATA:  Cat bite EXAM: LEFT FOREARM - 2 VIEW COMPARISON:  None. FINDINGS: No fracture or dislocation is seen. The joint spaces are preserved. Mild soft tissue swelling along the lateral/dorsal aspect of the distal radial shaft. No radiopaque foreign body is seen. IMPRESSION: Mild soft tissue swelling along the distal radial shaft. No fracture, dislocation, or radiopaque foreign body is seen. Electronically Signed   By: 06/23/2021 M.D.   On: 06/21/2021 20:58    Procedures Procedures   Medications Ordered in ED Medications  ampicillin-sulbactam (UNASYN) 1.5 g in sodium chloride 0.9 % 100 mL IVPB (1.5 g Intravenous New Bag/Given 06/21/21 2053)    ED Course  I have reviewed the triage vital signs and the nursing  notes.  Pertinent labs & imaging  results that were available during my care of the patient were reviewed by me and considered in my medical decision making (see chart for details).    MDM Rules/Calculators/A&P                          75 year old female with cat bite to left forearm on Friday.  She has treated with Augmentin.  Patient with spreading erythema despite outpatient treatment.  Doubt abscess at this time Discussed with Dr. Magnus Ivan, on-call for hand surgery, they will see in am Plan admission for IV antibiotics Discussed with Dr. August Luz who will see for admission Final Clinical Impression(s) / ED Diagnoses Final diagnoses:  Cat bite, initial encounter  Cellulitis of left upper extremity    Rx / DC Orders ED Discharge Orders     None        Margarita Grizzle, MD 06/21/21 2147

## 2021-06-21 NOTE — ED Notes (Signed)
ED TO INPATIENT HANDOFF REPORT  Name/Age/Gender Anna Lindsey 75 y.o. female  Code Status    Code Status Orders  (From admission, onward)         Start     Ordered   06/21/21 2250  Full code  Continuous        06/21/21 2249        Code Status History    This patient has a current code status but no historical code status.      Home/SNF/Other Home  Chief Complaint Cellulitis of left hand [L03.114]  Level of Care/Admitting Diagnosis ED Disposition    ED Disposition  Admit   Condition  --   Comment  H[facility] Area: [facility] [[id number]]  Level of Care: Med-Surg [16]  May admit patient to [facility] or [facility] if equivalent level of care is available:: No  Covid Evaluation: Asymptomatic Screening Protocol (No Symptoms)  Diagnosis: Cellulitis of left hand [161096]  Admitting Physician: Rometta Emery [[id number]]  Attending Physician: Rometta Emery [[id number]]  Estimated length of stay: past midnight tomorrow  Certification:: I certify this patient will need inpatient services for at least 2 midnights         Medical History Past Medical History:  Diagnosis Date  . Anxiety   . Broken ankle    crushed heel 2013  . Condyloma   . Depression   . Dysplasia of cervix, low grade (CIN 1) 1995   LEEP  . Fibromyalgia   . Fractured rib   . H/O: rheumatic fever   . Hepatitis B    with complete resolution- documentation - [facility]    Allergies Allergies  Allergen Reactions  . Ciprofloxacin Anxiety and Anaphylaxis  . Hydrocodone Nausea Only    IV Location/Drains/Wounds Patient Lines/Drains/Airways Status    Active Line/Drains/Airways    Name Placement date Placement time Site Days   Peripheral IV 06/21/21 20 G Anterior;Right Hand 06/21/21  2046  Hand  less than 1          Labs/Imaging Results for orders placed or performed during the hospital encounter of 06/21/21 (from the past 48 hour(s))  CBC     Status: None   Collection Time:  06/21/21  8:42 PM  Result Value Ref Range   WBC 10.2 4.0 - 10.5 K/uL   RBC 3.91 3.87 - 5.11 MIL/uL   Hemoglobin 12.2 12.0 - 15.0 g/dL   HCT 04.5 40.9 - 81.1 %   MCV 92.8 80.0 - 100.0 fL   MCH 31.2 26.0 - 34.0 pg   MCHC 33.6 30.0 - 36.0 g/dL   RDW 91.4 78.2 - 95.6 %   Platelets 310 150 - 400 K/uL   nRBC 0.0 0.0 - 0.2 %    Comment: Performed at [facility], [address] [place], [place] [address]  Basic metabolic panel     Status: Abnormal   Collection Time: 06/21/21  8:42 PM  Result Value Ref Range   Sodium 135 135 - 145 mmol/L   Potassium 4.2 3.5 - 5.1 mmol/L   Chloride 100 98 - 111 mmol/L   CO2 26 22 - 32 mmol/L   Glucose, Bld 89 70 - 99 mg/dL    Comment: Glucose reference range applies only to samples taken after fasting for at least 8 hours.   BUN 8 8 - 23 mg/dL   Creatinine, Ser 6.57 (L) 0.44 - 1.00 mg/dL   Calcium 9.5 8.9 - 84.6 mg/dL  GFR, Estimated >60 >60 mL/min    Comment: (NOTE) Calculated using the CKD-EPI Creatinine Equation (2021)    Anion gap 9 5 - 15    Comment: Performed at [facility], [address], [place], [place] [address]   DG Forearm Left  Result Date: 06/21/2021 CLINICAL DATA:  Cat bite EXAM: LEFT FOREARM - 2 VIEW COMPARISON:  None. FINDINGS: No fracture or dislocation is seen. The joint spaces are preserved. Mild soft tissue swelling along the lateral/dorsal aspect of the distal radial shaft. No radiopaque foreign body is seen. IMPRESSION: Mild soft tissue swelling along the distal radial shaft. No fracture, dislocation, or radiopaque foreign body is seen. Electronically Signed   By: Charline Bills M.D.   On: 06/21/2021 20:58    Pending Labs Unresulted Labs (From admission, onward)    Start     Ordered   06/28/21 0500  Creatinine, serum  (enoxaparin (LOVENOX)    CrCl >/= 30 ml/min)  Weekly,   R     Comments: while on enoxaparin therapy    06/21/21 2249   06/22/21 0500  CBC  Tomorrow morning,   R         06/21/21 2249   06/22/21 0500  Comprehensive metabolic panel  Tomorrow morning,   R        06/21/21 2249   06/21/21 2137  Resp Panel by RT-PCR (Flu A&B, Covid) Nasopharyngeal Swab  (Tier 2 - Symptomatic/asymptomatic with Precautions )  Once,   STAT       Question Answer Comment  Is this test for diagnosis or screening Screening   Symptomatic for COVID-19 as defined by CDC No   Hospitalized for COVID-19 No   Admitted to ICU for COVID-19 No   Previously tested for COVID-19 No   Resident in a congregate (group) care setting Unknown   Employed in healthcare setting Unknown   Pregnant No   Has patient completed COVID vaccination(s) (2 doses of Pfizer/Moderna 1 dose of [employer]) Yes   Has patient completed COVID Booster / 3rd dose Yes      06/21/21 2137   06/21/21 2027  Culture, blood (single)  ONCE - STAT,   STAT        06/21/21 2027   Signed and Held  CBC  (enoxaparin (LOVENOX)    CrCl >/= 30 ml/min)  Once,   R       Comments: Baseline for enoxaparin therapy IF NOT ALREADY DRAWN.  Notify MD if PLT < 100 K.    Signed and Held   Signed and Held  Creatinine, serum  (enoxaparin (LOVENOX)    CrCl >/= 30 ml/min)  Once,   R       Comments: Baseline for enoxaparin therapy IF NOT ALREADY DRAWN.    Signed and Held          Vitals/Pain Today's Vitals   06/21/21 1955 06/21/21 2002 06/21/21 2100 06/21/21 2130  BP: (!) 158/82  (!) 142/69 (!) 152/68  Pulse: 88  74 76  Resp: 15  18 20   Temp: 98.1 F (36.7 C)     TempSrc: Oral     SpO2: 97%  96% 97%  Weight: 48.5 kg     Height: 5\' 2"  (1.575 m)     PainSc:  8       Isolation Precautions Airborne and Contact precautions  Medications Medications  enoxaparin (LOVENOX) injection 40 mg (has no administration in time range)  0.45 % sodium chloride infusion (has  no administration in time range)  ketorolac (TORADOL) 15 MG/ML injection 15 mg (has no administration in time range)  acetaminophen (TYLENOL) tablet 650 mg (has no  administration in time range)    Or  acetaminophen (TYLENOL) suppository 650 mg (has no administration in time range)  ondansetron (ZOFRAN) tablet 4 mg (has no administration in time range)    Or  ondansetron (ZOFRAN) injection 4 mg (has no administration in time range)  Ampicillin-Sulbactam (UNASYN) 3 g in sodium chloride 0.9 % 100 mL IVPB (has no administration in time range)  ampicillin-sulbactam (UNASYN) 1.5 g in sodium chloride 0.9 % 100 mL IVPB (0 g Intravenous Stopped 06/21/21 2130)    Mobility walks

## 2021-06-22 DIAGNOSIS — L03114 Cellulitis of left upper limb: Secondary | ICD-10-CM | POA: Diagnosis not present

## 2021-06-22 DIAGNOSIS — W5501XA Bitten by cat, initial encounter: Secondary | ICD-10-CM

## 2021-06-22 LAB — CBC
HCT: 34.5 % — ABNORMAL LOW (ref 36.0–46.0)
Hemoglobin: 11.5 g/dL — ABNORMAL LOW (ref 12.0–15.0)
MCH: 31 pg (ref 26.0–34.0)
MCHC: 33.3 g/dL (ref 30.0–36.0)
MCV: 93 fL (ref 80.0–100.0)
Platelets: 302 10*3/uL (ref 150–400)
RBC: 3.71 MIL/uL — ABNORMAL LOW (ref 3.87–5.11)
RDW: 12.7 % (ref 11.5–15.5)
WBC: 8.8 10*3/uL (ref 4.0–10.5)
nRBC: 0 % (ref 0.0–0.2)

## 2021-06-22 LAB — COMPREHENSIVE METABOLIC PANEL
ALT: 18 U/L (ref 0–44)
AST: 23 U/L (ref 15–41)
Albumin: 3.5 g/dL (ref 3.5–5.0)
Alkaline Phosphatase: 59 U/L (ref 38–126)
Anion gap: 5 (ref 5–15)
BUN: 7 mg/dL — ABNORMAL LOW (ref 8–23)
CO2: 28 mmol/L (ref 22–32)
Calcium: 8.9 mg/dL (ref 8.9–10.3)
Chloride: 101 mmol/L (ref 98–111)
Creatinine, Ser: 0.37 mg/dL — ABNORMAL LOW (ref 0.44–1.00)
GFR, Estimated: >60 mL/min (ref >60)
Glucose, Bld: 105 mg/dL — ABNORMAL HIGH (ref 70–99)
Potassium: 3.3 mmol/L — ABNORMAL LOW (ref 3.5–5.1)
Sodium: 134 mmol/L — ABNORMAL LOW (ref 135–145)
Total Bilirubin: 0.7 mg/dL (ref 0.3–1.2)
Total Protein: 6.3 g/dL — ABNORMAL LOW (ref 6.5–8.1)

## 2021-06-22 MED ORDER — NORTRIPTYLINE HCL 25 MG PO CAPS
75.0000 mg | ORAL_CAPSULE | Freq: Every day | ORAL | Status: DC
  Filled 2021-06-22 (×2): qty 3

## 2021-06-22 MED ORDER — POTASSIUM CHLORIDE CRYS ER 20 MEQ PO TBCR
40.0000 meq | EXTENDED_RELEASE_TABLET | Freq: Once | ORAL | Status: AC
  Administered 2021-06-22: 40 meq via ORAL
  Filled 2021-06-22: qty 2

## 2021-06-22 MED ORDER — ADULT MULTIVITAMIN W/MINERALS CH
1.0000 | ORAL_TABLET | Freq: Every day | ORAL | Status: DC
  Administered 2021-06-22 – 2021-06-23 (×2): 1 via ORAL
  Filled 2021-06-22 (×2): qty 1

## 2021-06-22 MED ORDER — ASPIRIN EC 81 MG PO TBEC
81.0000 mg | DELAYED_RELEASE_TABLET | Freq: Every day | ORAL | Status: DC
  Administered 2021-06-22 – 2021-06-23 (×2): 81 mg via ORAL
  Filled 2021-06-22 (×2): qty 1

## 2021-06-22 MED ORDER — CITALOPRAM HYDROBROMIDE 20 MG PO TABS
20.0000 mg | ORAL_TABLET | Freq: Every day | ORAL | Status: DC
  Administered 2021-06-22 – 2021-06-23 (×2): 20 mg via ORAL
  Filled 2021-06-22 (×2): qty 1

## 2021-06-22 MED ORDER — ATORVASTATIN CALCIUM 40 MG PO TABS
40.0000 mg | ORAL_TABLET | Freq: Every day | ORAL | Status: DC
  Administered 2021-06-22: 40 mg via ORAL
  Filled 2021-06-22 (×2): qty 1

## 2021-06-22 NOTE — Progress Notes (Signed)
Progress Note    Anna Lindsey  NKN:[id number] DOB: [birth year]  DOA: 06/21/2021 PCP: Patient, No Pcp Per (Inactive)    Brief Narrative:     Medical records reviewed and are as summarized below:  Anna Lindsey is an 75 y.o. female with medical history significant of depression with anxiety, fibromyalgia, hepatitis B who was bitten by her cat at home.  She was bitten at the left forearm.  Noted immediate swelling redness and pain.  Patient went to urgent care center where she was started on Augmentin yesterday.  She noticed worsening of the swelling and the streaking of the arm.    Assessment/Plan:   Principal Problem:   Cellulitis of left hand Active Problems:   Depression   GERD (gastroesophageal reflux disease)   Hyperlipemia   Cat bite   Cellulitis of left upper extremity   cellulitis of the left forearm:  -Secondary to cat bite.   -improved on IV Unasyn. -no need for surgery currently per orthopedics -photo in chart -continue IV abx for 24-48 more hours -elevate extremity   Hyponatremia/hypokalemia -d/c odd choice of IVF as suspect this is causing  GERD: Continue with PPIs   depression: resume home meds   hyperlipidemia: resume home meds     Family Communication/Anticipated D/C date and plan/Code Status   DVT prophylaxis: Lovenox ordered. Code Status: Full Code.  Disposition Plan: Status is: Inpatient  Remains inpatient appropriate because:Inpatient level of care appropriate due to severity of illness  Dispo: The patient is from: Home              Anticipated d/c is to: Home              Patient currently is not medically stable to d/c.   Difficult to place patient No         Medical Consultants:   orthopedics    Subjective:   Arm feels better since the ER  Objective:    Vitals:   06/21/21 2130 06/22/21 0015 06/22/21 0203 06/22/21 0401  BP: (!) 152/68 (!) 147/66 140/67 129/61  Pulse: 76 73 70 65  Resp: 20 18 16 17   Temp:  (!)  97.4 F (36.3 C) 98.5 F (36.9 C) 97.6 F (36.4 C)  TempSrc:  Oral Oral Oral  SpO2: 97% 100% 98% 98%  Weight:      Height:        Intake/Output Summary (Last 24 hours) at 06/22/2021 0830 Last data filed at 06/22/2021 0500 Gross per 24 hour  Intake 909.62 ml  Output --  Net 909.62 ml   Filed Weights   06/21/21 1955  Weight: 48.5 kg    Exam:  General: Appearance:    Thin female in no acute distress     Lungs:     respirations unlabored  Heart:    Normal heart rate. Normal rhythm. No murmurs, rubs, or gallops.    MS:   All extremities are intact.         Neurologic:   Awake, alert, oriented x 3. No apparent focal neurological           defect.      Data Reviewed:   I have personally reviewed following labs and imaging studies:  Labs: Labs show the following:   Basic Metabolic Panel: Recent Labs  Lab 06/21/21 2042 06/22/21 0312  NA 135 134*  K 4.2 3.3*  CL 100 101  CO2 26 28  GLUCOSE 89 105*  BUN 8 7*  CREATININE 0.42* 0.37*  CALCIUM 9.5 8.9   GFR Estimated Creatinine Clearance: 47.2 mL/min (A) (by C-G formula based on SCr of 0.37 mg/dL (L)). Liver Function Tests: Recent Labs  Lab 06/22/21 0312  AST 23  ALT 18  ALKPHOS 59  BILITOT 0.7  PROT 6.3*  ALBUMIN 3.5   No results for input(s): LIPASE, AMYLASE in the last 168 hours. No results for input(s): AMMONIA in the last 168 hours. Coagulation profile No results for input(s): INR, PROTIME in the last 168 hours.  CBC: Recent Labs  Lab 06/21/21 2042 06/22/21 0312  WBC 10.2 8.8  HGB 12.2 11.5*  HCT 36.3 34.5*  MCV 92.8 93.0  PLT 310 302   Cardiac Enzymes: No results for input(s): CKTOTAL, CKMB, CKMBINDEX, TROPONINI in the last 168 hours. BNP (last 3 results) No results for input(s): PROBNP in the last 8760 hours. CBG: No results for input(s): GLUCAP in the last 168 hours. D-Dimer: No results for input(s): DDIMER in the last 72 hours. Hgb A1c: No results for input(s): HGBA1C in the  last 72 hours. Lipid Profile: No results for input(s): CHOL, HDL, LDLCALC, TRIG, CHOLHDL, LDLDIRECT in the last 72 hours. Thyroid function studies: No results for input(s): TSH, T4TOTAL, T3FREE, THYROIDAB in the last 72 hours.  Invalid input(s): FREET3 Anemia work up: No results for input(s): VITAMINB12, FOLATE, FERRITIN, TIBC, IRON, RETICCTPCT in the last 72 hours. Sepsis Labs: Recent Labs  Lab 06/21/21 2042 06/22/21 0312  WBC 10.2 8.8    Microbiology Recent Results (from the past 240 hour(s))  Culture, blood (single)     Status: None (Preliminary result)   Collection Time: 06/21/21  8:42 PM   Specimen: BLOOD  Result Value Ref Range Status   Specimen Description   Final    BLOOD SITE NOT SPECIFIED Performed at [facility], [address] [place], [place] [address]    Special Requests   Final    BOTTLES DRAWN AEROBIC AND ANAEROBIC Blood Culture results may not be optimal due to an inadequate volume of blood received in culture bottles Performed at [facility], [address] [place], [place] [address]    Culture PENDING  Incomplete   Report Status PENDING  Incomplete  Resp Panel by RT-PCR (Flu A&B, Covid) Nasopharyngeal Swab     Status: None   Collection Time: 06/21/21  9:37 PM   Specimen: Nasopharyngeal Swab; Nasopharyngeal(NP) swabs in vial transport medium  Result Value Ref Range Status   SARS Coronavirus 2 by RT PCR NEGATIVE NEGATIVE Final    Comment: (NOTE) SARS-CoV-2 target nucleic acids are NOT DETECTED.  The SARS-CoV-2 RNA is generally detectable in upper respiratory specimens during the acute phase of infection. The lowest concentration of SARS-CoV-2 viral copies this assay can detect is 138 copies/mL. A negative result does not preclude SARS-Cov-2 infection and should not be used as the sole basis for treatment or other patient management decisions. A negative result may occur with  improper specimen collection/handling, submission  of specimen other than nasopharyngeal swab, presence of viral mutation(s) within the areas targeted by this assay, and inadequate number of viral copies(<138 copies/mL). A negative result must be combined with clinical observations, patient history, and epidemiological information. The expected result is Negative.  Fact Sheet for Patients:  BloggerCourse.com  Fact Sheet for Healthcare Providers:  SeriousBroker.it  This test is no t yet approved or cleared by the [place] FDA and  has been authorized for detection and/or diagnosis of SARS-CoV-2 by FDA  under an Emergency Use Authorization (EUA). This EUA will remain  in effect (meaning this test can be used) for the duration of the COVID-19 declaration under Section 564(b)(1) of the Act, 21 U.S.C.section 360bbb-3(b)(1), unless the authorization is terminated  or revoked sooner.       Influenza A by PCR NEGATIVE NEGATIVE Final   Influenza B by PCR NEGATIVE NEGATIVE Final    Comment: (NOTE) The Xpert Xpress SARS-CoV-2/FLU/RSV plus assay is intended as an aid in the diagnosis of influenza from Nasopharyngeal swab specimens and should not be used as a sole basis for treatment. Nasal washings and aspirates are unacceptable for Xpert Xpress SARS-CoV-2/FLU/RSV testing.  Fact Sheet for Patients: BloggerCourse.com  Fact Sheet for Healthcare Providers: SeriousBroker.it  This test is not yet approved or cleared by the [place] FDA and has been authorized for detection and/or diagnosis of SARS-CoV-2 by FDA under an Emergency Use Authorization (EUA). This EUA will remain in effect (meaning this test can be used) for the duration of the COVID-19 declaration under Section 564(b)(1) of the Act, 21 U.S.C. section 360bbb-3(b)(1), unless the authorization is terminated or revoked.  Performed at [facility], [address] [place], [place] [address]     Procedures and diagnostic studies:  DG Forearm Left  Result Date: 06/21/2021 CLINICAL DATA:  Cat bite EXAM: LEFT FOREARM - 2 VIEW COMPARISON:  None. FINDINGS: No fracture or dislocation is seen. The joint spaces are preserved. Mild soft tissue swelling along the lateral/dorsal aspect of the distal radial shaft. No radiopaque foreign body is seen. IMPRESSION: Mild soft tissue swelling along the distal radial shaft. No fracture, dislocation, or radiopaque foreign body is seen. Electronically Signed   By: Charline Bills M.D.   On: 06/21/2021 20:58    Medications:    enoxaparin (LOVENOX) injection  40 mg Subcutaneous Q24H   Continuous Infusions:  sodium chloride 100 mL/hr at 06/22/21 0110   ampicillin-sulbactam (UNASYN) IV 3 g (06/22/21 0440)     LOS: 1 day   Joseph Art  [facility]   How to contact the [facility] Attending or Consulting provider 7A - 7P or covering provider during after hours 7P -7A, for this patient?  Check the care team in [facility] and look for a) attending/consulting [facility] provider listed and b) the [facility] team listed Log into [email or web address] and use [facility] universal password to access. If you do not have the password, please contact the hospital operator. Locate the [facility] provider you are looking for under [facility] and page to a number that you can be directly reached. If you still have difficulty reaching the provider, please page the [facility] (Director on Call) for the Hospitalists listed on amion for assistance.  06/22/2021, 8:30 AM

## 2021-06-22 NOTE — Progress Notes (Signed)
Patient ID: Anna Lindsey, female   DOB: 02/05/1946, 75 y.o.   MRN: [id number] This is a second time that I have checked on the patient's left arm.  I saw her this morning before 8:00.  She does report less pain.  The cellulitis looks like it is resolving in her left forearm.  There is an area of bruising on the dorsal aspect of the forearm.  There is no area of drainage or fluctuance.  The swelling in her hand is less.  I can flex and extend her fingers and wrist with no significant discomfort.  Thus far, there is not indication for surgery.  I do believe firmly that she should stay here overnight for continued IV antibiotics.  I will then reassess her tomorrow morning.  Hopefully discharge can be considered later in the afternoon tomorrow if continued improvements are seen.

## 2021-06-22 NOTE — Plan of Care (Signed)

## 2021-06-22 NOTE — Consult Note (Signed)
Reason for Consult:  Celluliis left forearm post cat bite Referring Physician: EDP  Anna Lindsey is an 75 y.o. female.  HPI:   75 yo female who presented to the ED last evening with worsening cellulitis and pain with her left forearm after a cat bite.  She had been seen at urgent care the day before and started on oral antibiotics.  With worsening left forearm swelling and pain with redness, she returned to the ED last evening.  She was admitted to the Medicine Service and started on IV antibiotics.  She does report improvement already this am.  Past Medical History:  Diagnosis Date   Anxiety    Broken ankle    crushed heel 2013   Condyloma    Depression    Dysplasia of cervix, low grade (CIN 1) 1995   LEEP   Fibromyalgia    Fractured rib    H/O: rheumatic fever    Hepatitis B    with complete resolution- documentation - SAG    Past Surgical History:  Procedure Laterality Date   BLADDER SURGERY  1980   EXPLORATORY LAPAROTOMY     Due to bladder injury with fall of a horse   HYSTEROSCOPY  2004   D&C Polyp   LEEP     TONSILLECTOMY     childhood    Family History  Problem Relation Age of Onset   Anxiety disorder Mother    Cancer Mother        skin   Thyroid disease Mother    Heart disease Mother        afib   Depression Father    Colon cancer Father    Hypertension Father    Depression Sister    Kidney disease Maternal Grandmother    Parkinson's disease Maternal Grandfather    Diabetes Paternal Grandmother    Heart disease Paternal Grandfather    Breast cancer Paternal Aunt     Social History:  reports that she has never smoked. She has never used smokeless tobacco. She reports that she does not drink alcohol and does not use drugs.  Allergies:  Allergies  Allergen Reactions   Ciprofloxacin Anxiety and Anaphylaxis   Hydrocodone Nausea Only    Medications: I have reviewed the patient's current medications.  Results for orders placed or performed during the  hospital encounter of 06/21/21 (from the past 48 hour(s))  CBC     Status: None   Collection Time: 06/21/21  8:42 PM  Result Value Ref Range   WBC 10.2 4.0 - 10.5 K/uL   RBC 3.91 3.87 - 5.11 MIL/uL   Hemoglobin 12.2 12.0 - 15.0 g/dL   HCT 84.1 32.4 - 40.1 %   MCV 92.8 80.0 - 100.0 fL   MCH 31.2 26.0 - 34.0 pg   MCHC 33.6 30.0 - 36.0 g/dL   RDW 02.7 25.3 - 66.4 %   Platelets 310 150 - 400 K/uL   nRBC 0.0 0.0 - 0.2 %    Comment: Performed at [facility], [address], [place], [place] [address]  Basic metabolic panel     Status: Abnormal   Collection Time: 06/21/21  8:42 PM  Result Value Ref Range   Sodium 135 135 - 145 mmol/L   Potassium 4.2 3.5 - 5.1 mmol/L   Chloride 100 98 - 111 mmol/L   CO2 26 22 - 32 mmol/L   Glucose, Bld 89 70 - 99 mg/dL    Comment: Glucose reference range applies  only to samples taken after fasting for at least 8 hours.   BUN 8 8 - 23 mg/dL   Creatinine, Ser 8.33 (L) 0.44 - 1.00 mg/dL   Calcium 9.5 8.9 - 82.5 mg/dL   GFR, Estimated >05 >39 mL/min    Comment: (NOTE) Calculated using the CKD-EPI Creatinine Equation (2021)    Anion gap 9 5 - 15    Comment: Performed at [facility], [address], [place], [place] [address]  Culture, blood (single)     Status: None (Preliminary result)   Collection Time: 06/21/21  8:42 PM   Specimen: BLOOD  Result Value Ref Range   Specimen Description      BLOOD SITE NOT SPECIFIED Performed at [facility], [address], [place], [place] [address]    Special Requests      BOTTLES DRAWN AEROBIC AND ANAEROBIC Blood Culture results may not be optimal due to an inadequate volume of blood received in culture bottles Performed at [facility], [address] [place], [place] [address]    Culture PENDING    Report Status PENDING   Resp Panel by RT-PCR (Flu A&B, Covid) Nasopharyngeal Swab     Status: None   Collection Time: 06/21/21  9:37 PM   Specimen:  Nasopharyngeal Swab; Nasopharyngeal(NP) swabs in vial transport medium  Result Value Ref Range   SARS Coronavirus 2 by RT PCR NEGATIVE NEGATIVE    Comment: (NOTE) SARS-CoV-2 target nucleic acids are NOT DETECTED.  The SARS-CoV-2 RNA is generally detectable in upper respiratory specimens during the acute phase of infection. The lowest concentration of SARS-CoV-2 viral copies this assay can detect is 138 copies/mL. A negative result does not preclude SARS-Cov-2 infection and should not be used as the sole basis for treatment or other patient management decisions. A negative result may occur with  improper specimen collection/handling, submission of specimen other than nasopharyngeal swab, presence of viral mutation(s) within the areas targeted by this assay, and inadequate number of viral copies(<138 copies/mL). A negative result must be combined with clinical observations, patient history, and epidemiological information. The expected result is Negative.  Fact Sheet for Patients:  BloggerCourse.com  Fact Sheet for Healthcare Providers:  SeriousBroker.it  This test is no t yet approved or cleared by the [place] FDA and  has been authorized for detection and/or diagnosis of SARS-CoV-2 by FDA under an Emergency Use Authorization (EUA). This EUA will remain  in effect (meaning this test can be used) for the duration of the COVID-19 declaration under Section 564(b)(1) of the Act, 21 U.S.C.section 360bbb-3(b)(1), unless the authorization is terminated  or revoked sooner.       Influenza A by PCR NEGATIVE NEGATIVE   Influenza B by PCR NEGATIVE NEGATIVE    Comment: (NOTE) The Xpert Xpress SARS-CoV-2/FLU/RSV plus assay is intended as an aid in the diagnosis of influenza from Nasopharyngeal swab specimens and should not be used as a sole basis for treatment. Nasal washings and aspirates are unacceptable for Xpert Xpress  SARS-CoV-2/FLU/RSV testing.  Fact Sheet for Patients: BloggerCourse.com  Fact Sheet for Healthcare Providers: SeriousBroker.it  This test is not yet approved or cleared by the [place] FDA and has been authorized for detection and/or diagnosis of SARS-CoV-2 by FDA under an Emergency Use Authorization (EUA). This EUA will remain in effect (meaning this test can be used) for the duration of the COVID-19 declaration under Section 564(b)(1) of the Act, 21 U.S.C. section 360bbb-3(b)(1), unless the authorization is  terminated or revoked.  Performed at [facility], [address] [place], [place] [address]   CBC     Status: Abnormal   Collection Time: 06/22/21  3:12 AM  Result Value Ref Range   WBC 8.8 4.0 - 10.5 K/uL   RBC 3.71 (L) 3.87 - 5.11 MIL/uL   Hemoglobin 11.5 (L) 12.0 - 15.0 g/dL   HCT 09.834.5 (L) 11.936.0 - 14.746.0 %   MCV 93.0 80.0 - 100.0 fL   MCH 31.0 26.0 - 34.0 pg   MCHC 33.3 30.0 - 36.0 g/dL   RDW 82.912.7 56.211.5 - 13.015.5 %   Platelets 302 150 - 400 K/uL   nRBC 0.0 0.0 - 0.2 %    Comment: Performed at [facility], [address] [place], [place] [address]  Comprehensive metabolic panel     Status: Abnormal   Collection Time: 06/22/21  3:12 AM  Result Value Ref Range   Sodium 134 (L) 135 - 145 mmol/L   Potassium 3.3 (L) 3.5 - 5.1 mmol/L    Comment: DELTA CHECK NOTED   Chloride 101 98 - 111 mmol/L   CO2 28 22 - 32 mmol/L   Glucose, Bld 105 (H) 70 - 99 mg/dL    Comment: Glucose reference range applies only to samples taken after fasting for at least 8 hours.   BUN 7 (L) 8 - 23 mg/dL   Creatinine, Ser 4.690.37 (L) 0.44 - 1.00 mg/dL   Calcium 8.9 8.9 - 62.910.3 mg/dL   Total Protein 6.3 (L) 6.5 - 8.1 g/dL   Albumin 3.5 3.5 - 5.0 g/dL   AST 23 15 - 41 U/L   ALT 18 0 - 44 U/L   Alkaline Phosphatase 59 38 - 126 U/L   Total Bilirubin 0.7 0.3 - 1.2 mg/dL   GFR, Estimated >52>60 >84>60 mL/min     Comment: (NOTE) Calculated using the CKD-EPI Creatinine Equation (2021)    Anion gap 5 5 - 15    Comment: Performed at [facility], [address] [place], [place] [address]    DG Forearm Left  Result Date: 06/21/2021 CLINICAL DATA:  Cat bite EXAM: LEFT FOREARM - 2 VIEW COMPARISON:  None. FINDINGS: No fracture or dislocation is seen. The joint spaces are preserved. Mild soft tissue swelling along the lateral/dorsal aspect of the distal radial shaft. No radiopaque foreign body is seen. IMPRESSION: Mild soft tissue swelling along the distal radial shaft. No fracture, dislocation, or radiopaque foreign body is seen. Electronically Signed   By: Charline BillsSriyesh  Krishnan M.D.   On: 06/21/2021 20:58    Review of Systems  All other systems reviewed and are negative. Blood pressure 129/61, pulse 65, temperature 97.6 F (36.4 C), temperature source Oral, resp. rate 17, height 5\' 2"  (1.575 m), weight 48.5 kg, last menstrual period 08/06/2000, SpO2 98 %. Physical Exam Vitals reviewed.  Constitutional:      Appearance: Normal appearance.  HENT:     Head: Normocephalic and atraumatic.  Eyes:     Extraocular Movements: Extraocular movements intact.     Pupils: Pupils are equal, round, and reactive to light.  Cardiovascular:     Rate and Rhythm: Normal rate.  Pulmonary:     Effort: Pulmonary effort is normal.  Abdominal:     Palpations: Abdomen is soft.  Musculoskeletal:       Arms:     Cervical back: Normal range of motion and neck supple.  Neurological:     Mental Status:  She is alert and oriented to person, place, and time.  Psychiatric:        Behavior: Behavior normal.    No area of drainage or fluctuance.  No induration.  Cellulitis looks better from the photos in Epic from yesterday.  She can move her wrist, fingers and thumb with no difficulty.  There is nor area of drainage or area that needs urgent I&D thus far.    Assessment/Plan: Left forearm cellulitis post cat  bite  She does seem to be improving on IV antibiotics.  I spoke with her about strict elevation of her left forearm.  She will need to continue the IV antibiotics for 24 -48 hours.  Thus far, given the improvement, surgery is not indicated.  I will continue to check on her closely.  Kathryne Hitch 06/22/2021, 7:34 AM

## 2021-06-22 NOTE — Plan of Care (Signed)
  Problem: Activity: Goal: Risk for activity intolerance will decrease Outcome: Progressing   Problem: Pain Managment: Goal: General experience of comfort will improve Outcome: Progressing   Problem: Safety: Goal: Ability to remain free from injury will improve Outcome: Progressing   

## 2021-06-23 DIAGNOSIS — L03114 Cellulitis of left upper limb: Secondary | ICD-10-CM | POA: Diagnosis not present

## 2021-06-23 LAB — BASIC METABOLIC PANEL
Anion gap: 9 (ref 5–15)
BUN: 8 mg/dL (ref 8–23)
CO2: 26 mmol/L (ref 22–32)
Calcium: 9.1 mg/dL (ref 8.9–10.3)
Chloride: 100 mmol/L (ref 98–111)
Creatinine, Ser: 0.48 mg/dL (ref 0.44–1.00)
GFR, Estimated: >60 mL/min (ref >60)
Glucose, Bld: 97 mg/dL (ref 70–99)
Potassium: 4 mmol/L (ref 3.5–5.1)
Sodium: 135 mmol/L (ref 135–145)

## 2021-06-23 NOTE — Plan of Care (Signed)

## 2021-06-23 NOTE — Progress Notes (Signed)
Patient ID: Anna Lindsey, female   DOB: [birth year], 75 y.o.   MRN: [id number] Patient continues to improve clinically.  The cellulitis has resolved.  There is no area of fluctuance but there is definitely bruising on the dorsum of her forearm.  This area is not painful to deep palpation.  She is able to extend her fingers and wrist easily with no pain.  Thus far, there is still not an indication for surgery given her significant improvement.  She can likely be transition back to oral antibiotics and discharged to home later this afternoon.  She can be seen in follow-up in 1 week.

## 2021-06-23 NOTE — Discharge Summary (Signed)
Physician Discharge Summary  Anna Lindsey:[id number] DOB: 1946-01-07 DOA: 06/21/2021  PCP: Patient, No Pcp Per (Inactive)  Admit date: 06/21/2021 Discharge date: 06/23/2021  Admitted From: home Discharge disposition: home   Recommendations for Outpatient Follow-Up:   Finish abx Basic wound care/elevation   Discharge Diagnosis:   Principal Problem:   Cellulitis of left hand Active Problems:   Depression   GERD (gastroesophageal reflux disease)   Hyperlipemia   Cat bite   Cellulitis of left upper extremity    Discharge Condition: Improved.  Diet recommendation: Low sodium, heart healthy.     Code status: Full.   History of Present Illness:   Anna Lindsey is a 75 y.o. female with medical history significant of depression with anxiety, fibromyalgia, hepatitis B who was bitten by her cat at home.  She was bitten at the left forearm.  Noted immediate swelling redness and pain.  Patient went to urgent care center where she was started on Augmentin yesterday.  She noticed worsening of the swelling and the streaking of the arm.  She therefore decided to come in to the ER for further evaluation and treatment.  She denied any fever but has some mild chills.  No nausea vomiting or diarrhea.  Patient's arm was noted to have swelling with streaking.  At this point she is being admitted to the hospital for evaluation and treatment.  Surgery has been consulted to assist with treatment.Marland Kitchen   Hospital Course by Problem:   cellulitis of the left forearm:  -Secondary to cat bite.   -improved on IV Unasyn. -no need for surgery currently per orthopedics -after IV today will d/c home to finish PO augmentin   Hyponatremia/hypokalemia -resolved   GERD: Continue with PPIs   depression: resume home meds   hyperlipidemia: resume home meds    Medical Consultants:   ortho   Discharge Exam:   Vitals:   06/22/21 2156 06/23/21 0554  BP: (!) 149/76 137/77  Pulse: 75 69   Resp: 16 16  Temp: 98.2 F (36.8 C) 97.7 F (36.5 C)  SpO2: 98% 97%   Vitals:   06/22/21 1049 06/22/21 1437 06/22/21 2156 06/23/21 0554  BP: (!) 142/70 (!) 146/64 (!) 149/76 137/77  Pulse: 84 87 75 69  Resp: 16 18 16 16   Temp: 98.7 F (37.1 C) 98.7 F (37.1 C) 98.2 F (36.8 C) 97.7 F (36.5 C)  TempSrc: Oral Oral Oral Oral  SpO2: 98% 98% 98% 97%  Weight:      Height:        General exam: Appears calm and comfortable.   The results of significant diagnostics from this hospitalization (including imaging, microbiology, ancillary and laboratory) are listed below for reference.     Procedures and Diagnostic Studies:   DG Forearm Left  Result Date: 06/21/2021 CLINICAL DATA:  Cat bite EXAM: LEFT FOREARM - 2 VIEW COMPARISON:  None. FINDINGS: No fracture or dislocation is seen. The joint spaces are preserved. Mild soft tissue swelling along the lateral/dorsal aspect of the distal radial shaft. No radiopaque foreign body is seen. IMPRESSION: Mild soft tissue swelling along the distal radial shaft. No fracture, dislocation, or radiopaque foreign body is seen. Electronically Signed   By: 06/23/2021 M.D.   On: 06/21/2021 20:58     Labs:   Basic Metabolic Panel: Recent Labs  Lab 06/21/21 2042 06/22/21 0312 06/23/21 0311  NA 135 134* 135  K 4.2 3.3* 4.0  CL 100 101 100  CO2 26 28 26   GLUCOSE 89 105* 97  BUN 8 7* 8  CREATININE 0.42* 0.37* 0.48  CALCIUM 9.5 8.9 9.1   GFR Estimated Creatinine Clearance: 47.2 mL/min (by C-G formula based on SCr of 0.48 mg/dL). Liver Function Tests: Recent Labs  Lab 06/22/21 0312  AST 23  ALT 18  ALKPHOS 59  BILITOT 0.7  PROT 6.3*  ALBUMIN 3.5   No results for input(s): LIPASE, AMYLASE in the last 168 hours. No results for input(s): AMMONIA in the last 168 hours. Coagulation profile No results for input(s): INR, PROTIME in the last 168 hours.  CBC: Recent Labs  Lab 06/21/21 2042 06/22/21 0312  WBC 10.2 8.8  HGB 12.2  11.5*  HCT 36.3 34.5*  MCV 92.8 93.0  PLT 310 302   Cardiac Enzymes: No results for input(s): CKTOTAL, CKMB, CKMBINDEX, TROPONINI in the last 168 hours. BNP: Invalid input(s): POCBNP CBG: No results for input(s): GLUCAP in the last 168 hours. D-Dimer No results for input(s): DDIMER in the last 72 hours. Hgb A1c No results for input(s): HGBA1C in the last 72 hours. Lipid Profile No results for input(s): CHOL, HDL, LDLCALC, TRIG, CHOLHDL, LDLDIRECT in the last 72 hours. Thyroid function studies No results for input(s): TSH, T4TOTAL, T3FREE, THYROIDAB in the last 72 hours.  Invalid input(s): FREET3 Anemia work up No results for input(s): VITAMINB12, FOLATE, FERRITIN, TIBC, IRON, RETICCTPCT in the last 72 hours. Microbiology Recent Results (from the past 240 hour(s))  Culture, blood (single)     Status: None (Preliminary result)   Collection Time: 06/21/21  8:42 PM   Specimen: BLOOD  Result Value Ref Range Status   Specimen Description   Final    BLOOD SITE NOT SPECIFIED Performed at [facility], [address] [place], [place]    Special Requests   Final    BOTTLES DRAWN AEROBIC AND ANAEROBIC Blood Culture results may not be optimal due to an inadequate volume of blood received in culture bottles Performed at [facility], [address], [place], [place]    Culture   Final    NO GROWTH 1 DAY Performed at [facility], [address] [place], [place]    Report Status PENDING  Incomplete  Resp Panel by RT-PCR (Flu A&B, Covid) Nasopharyngeal Swab     Status: None   Collection Time: 06/21/21  9:37 PM   Specimen: Nasopharyngeal Swab; Nasopharyngeal(NP) swabs in vial transport medium  Result Value Ref Range Status   SARS Coronavirus 2 by RT PCR NEGATIVE NEGATIVE Final    Comment: (NOTE) SARS-CoV-2 target nucleic acids are NOT DETECTED.  The SARS-CoV-2 RNA is generally detectable in upper respiratory specimens  during the acute phase of infection. The lowest concentration of SARS-CoV-2 viral copies this assay can detect is 138 copies/mL. A negative result does not preclude SARS-Cov-2 infection and should not be used as the sole basis for treatment or other patient management decisions. A negative result may occur with  improper specimen collection/handling, submission of specimen other than nasopharyngeal swab, presence of viral mutation(s) within the areas targeted by this assay, and inadequate number of viral copies(<138 copies/mL). A negative result must be combined with clinical observations, patient history, and epidemiological information. The expected result is Negative.  Fact Sheet for Patients:  06/23/21  Fact Sheet for Healthcare Providers:  BloggerCourse.com  This test is no t yet approved or cleared by the [place] FDA and  has been  authorized for detection and/or diagnosis of SARS-CoV-2 by FDA under an Emergency Use Authorization (EUA). This EUA will remain  in effect (meaning this test can be used) for the duration of the COVID-19 declaration under Section 564(b)(1) of the Act, 21 U.S.C.section 360bbb-3(b)(1), unless the authorization is terminated  or revoked sooner.       Influenza A by PCR NEGATIVE NEGATIVE Final   Influenza B by PCR NEGATIVE NEGATIVE Final    Comment: (NOTE) The Xpert Xpress SARS-CoV-2/FLU/RSV plus assay is intended as an aid in the diagnosis of influenza from Nasopharyngeal swab specimens and should not be used as a sole basis for treatment. Nasal washings and aspirates are unacceptable for Xpert Xpress SARS-CoV-2/FLU/RSV testing.  Fact Sheet for Patients: BloggerCourse.com  Fact Sheet for Healthcare Providers: SeriousBroker.it  This test is not yet approved or cleared by the [place] FDA and has been authorized for detection  and/or diagnosis of SARS-CoV-2 by FDA under an Emergency Use Authorization (EUA). This EUA will remain in effect (meaning this test can be used) for the duration of the COVID-19 declaration under Section 564(b)(1) of the Act, 21 U.S.C. section 360bbb-3(b)(1), unless the authorization is terminated or revoked.  Performed at [facility], [address] [place], [place] [address]      Discharge Instructions:   Discharge Instructions     Call MD for:  redness, tenderness, or signs of infection (pain, swelling, redness, odor or green/yellow discharge around incision site)   Complete by: As directed    Diet general   Complete by: As directed    Increase activity slowly   Complete by: As directed    No wound care   Complete by: As directed       Allergies as of 06/23/2021       Reactions   Ciprofloxacin Anxiety, Anaphylaxis   Hydrocodone Nausea Only        Medication List     TAKE these medications    amoxicillin-clavulanate 875-125 MG tablet Commonly known as: AUGMENTIN Take 1 tablet by mouth every 12 (twelve) hours.   aspirin 81 MG tablet Take 81 mg by mouth daily.   atorvastatin 40 MG tablet Commonly known as: LIPITOR Take 40 mg by mouth daily.   CALCIUM + D PO Take 600 mg by mouth daily. + magnezium & zinc   citalopram 20 MG tablet Commonly known as: CELEXA TAKE 1 TABLET BY MOUTH ATBEDTIME.   multivitamin tablet Take 1 tablet by mouth daily.   nortriptyline 75 MG capsule Commonly known as: PAMELOR Take 1 capsule (75 mg total) by mouth at bedtime.        Follow-up Information     Kathryne Hitch, MD. Schedule an appointment as soon as possible for a visit in 1 week(s).   Specialty: Orthopedic Surgery Contact information: [address] [place] [address] [phone]                  Time coordinating discharge: 35 min  Signed:  Joseph Art DO  [facility] 06/23/2021, 12:26 PM

## 2021-06-23 NOTE — Progress Notes (Signed)
Discharge package printed and instructions given to pt. Patient verbalizes understanding. 

## 2021-06-23 NOTE — Plan of Care (Signed)
  Problem: Activity: Goal: Risk for activity intolerance will decrease Outcome: Progressing   Problem: Pain Managment: Goal: General experience of comfort will improve Outcome: Progressing   Problem: Safety: Goal: Ability to remain free from injury will improve Outcome: Progressing   

## 2021-06-27 LAB — CULTURE, BLOOD (SINGLE): Culture: NO GROWTH

## 2021-06-30 ENCOUNTER — Encounter: Payer: Self-pay | Admitting: Family Medicine

## 2021-06-30 ENCOUNTER — Ambulatory Visit (INDEPENDENT_AMBULATORY_CARE_PROVIDER_SITE_OTHER): Payer: Medicare PPO | Admitting: Family Medicine

## 2021-06-30 ENCOUNTER — Other Ambulatory Visit: Payer: Self-pay

## 2021-06-30 DIAGNOSIS — L03114 Cellulitis of left upper limb: Secondary | ICD-10-CM

## 2021-06-30 NOTE — Progress Notes (Signed)
Office Visit Note   Patient: Anna Lindsey           Date of Birth: 01/24/1946           MRN: [id number] Visit Date: 06/30/2021 Requested by: No referring provider defined for this encounter. PCP: Patient, No Pcp Per (Inactive)  Subjective: Chief Complaint  Patient presents with   Left Forearm - Follow-up    DOI 06/19/21 -- arm is no longer red and only a little bit of tenderness to touch. She has finished the antibiotic. She is feeling "drained" and does not have much appetite still. ROM is good.     HPI: She is here for follow-up left arm cat bite resulting in cellulitis.  She was seen at the hospital and placed on Augmentin.  She is doing much better.  The wound has healed up nicely and she is not having fevers or chills.  She is finished with her antibiotics as of yesterday.                ROS:   All other systems were reviewed and are negative.  Objective: Vital Signs: LMP 08/06/2000   Physical Exam:  General:  Alert and oriented, in no acute distress. Pulm:  Breathing unlabored. Psy:  Normal mood, congruent affect. Skin: No cellulitis.  Wounds are closed and healing well. Left forearm: No significant tenderness to palpation today.  Good strength, intact tendon function of her wrist and fingers.    Imaging: No results found.  Assessment & Plan: Healing left arm cellulitis secondary to cat bite -Return if symptoms recur.     Procedures: No procedures performed        PMFS History: Patient Active Problem List   Diagnosis Date Noted   Cat bite    Cellulitis of left upper extremity    Cellulitis of left hand 06/21/2021   GERD (gastroesophageal reflux disease) 06/21/2021   Hyperlipemia 06/21/2021   Depression 01/16/2013   Past Medical History:  Diagnosis Date   Anxiety    Broken ankle    crushed heel 2013   Condyloma    Depression    Dysplasia of cervix, low grade (CIN 1) 1995   LEEP   Fibromyalgia    Fractured rib    H/O: rheumatic fever     Hepatitis B    with complete resolution- documentation - SAG    Family History  Problem Relation Age of Onset   Anxiety disorder Mother    Cancer Mother        skin   Thyroid disease Mother    Heart disease Mother        afib   Depression Father    Colon cancer Father    Hypertension Father    Depression Sister    Kidney disease Maternal Grandmother    Parkinson's disease Maternal Grandfather    Diabetes Paternal Grandmother    Heart disease Paternal Grandfather    Breast cancer Paternal Aunt     Past Surgical History:  Procedure Laterality Date   BLADDER SURGERY  1980   EXPLORATORY LAPAROTOMY     Due to bladder injury with fall of a horse   HYSTEROSCOPY  2004   D&C Polyp   LEEP     TONSILLECTOMY     childhood   Social History   Occupational History   Not on file  Tobacco Use   Smoking status: Never   Smokeless tobacco: Never  Vaping Use   Vaping Use: Never used  Substance and Sexual Activity   Alcohol use: No    Alcohol/week: 0.0 standard drinks   Drug use: No   Sexual activity: Not Currently    Partners: Male    Birth control/protection: Post-menopausal

## 2021-06-30 NOTE — Patient Instructions (Signed)
     Take Saccharomyces boulardii for a month or two.  Consume sauerkraut and/or kefir (fermented food for probiotics).  For arthritis:  Glucosamine Sulfate:  1,000 mg twice daily  Turmeric:  500 mg twice daily

## 2021-07-02 ENCOUNTER — Encounter (HOSPITAL_COMMUNITY): Payer: Self-pay | Admitting: Psychiatry

## 2021-07-02 ENCOUNTER — Telehealth (INDEPENDENT_AMBULATORY_CARE_PROVIDER_SITE_OTHER): Payer: Medicare PPO | Admitting: Psychiatry

## 2021-07-02 ENCOUNTER — Other Ambulatory Visit: Payer: Self-pay

## 2021-07-02 DIAGNOSIS — F419 Anxiety disorder, unspecified: Secondary | ICD-10-CM | POA: Diagnosis not present

## 2021-07-02 DIAGNOSIS — F330 Major depressive disorder, recurrent, mild: Secondary | ICD-10-CM | POA: Diagnosis not present

## 2021-07-02 MED ORDER — CITALOPRAM HYDROBROMIDE 20 MG PO TABS: ORAL_TABLET | ORAL | 0 refills | Status: DC

## 2021-07-02 MED ORDER — NORTRIPTYLINE HCL 75 MG PO CAPS: 75.0000 mg | ORAL_CAPSULE | Freq: Every day | ORAL | 0 refills | Status: DC

## 2021-07-02 NOTE — Progress Notes (Addendum)
Virtual Visit via Telephone Note  I connected with Anna Lindsey on 07/02/21 at  2:20 PM EDT by telephone and verified that I am speaking with the correct person using two identifiers.  Location: Patient: Home Provider: Home Office   I discussed the limitations, risks, security and privacy concerns of performing an evaluation and management service by telephone and the availability of in person appointments. I also discussed with the patient that there may be a patient responsible charge related to this service. The patient expressed understanding and agreed to proceed.   History of Present Illness: Patient is evaluated by phone session.  She was recently admitted in the hospital because of cat fight and required antibiotic.  She is feeling better.  She finished antibiotic but is still sometimes having itching which is gradually getting better.  She takes sometimes over-the-counter Benadryl to help the itching.  She is also concerned about her husband who is 1 year old and believes he may have Asperger but does not want to be evaluated.  Patient is not sure how to convince him because sometimes he gets very anxious.  Patient sleeping okay.  She denies any anxiety or any severe panic attack and she feels the current medicine helping her symptoms and they are manageable.  Her energy level is okay.  She is still keeps the cat as she loves animal.  Patient denies any tremors, shakes or any EPS.  She denies any hallucination, paranoia or any anhedonia.   Past Psychiatric History; H/O depression since 74s.  No h/o mania, psychosis, suicidal attempt or inpatient. Saw therapist on and off. We tried Lamictal but stopped due to rash.    Psychiatric Specialty Exam: Physical Exam  Review of Systems  Weight 107 lb (48.5 kg), last menstrual period 08/06/2000.There is no height or weight on file to calculate BMI.  General Appearance: NA  Eye Contact:  NA  Speech:  Clear and Coherent and Normal Rate   Volume:  Normal  Mood:  Anxious  Affect:  NA  Thought Process:  Goal Directed  Orientation:  Full (Time, Place, and Person)  Thought Content:  WDL  Suicidal Thoughts:  No  Homicidal Thoughts:  No  Memory:  Immediate;   Good Recent;   Good Remote;   Good  Judgement:  Intact  Insight:  Good  Psychomotor Activity:  NA  Concentration:  Concentration: Good and Attention Span: Good  Recall:  Good  Fund of Knowledge:  Good  Language:  Good  Akathisia:  No  Handed:  Right  AIMS (if indicated):     Assets:  Communication Skills Desire for Improvement Housing Resilience Social Support Transportation  ADL's:  Intact  Cognition:  WNL  Sleep:   ok      Assessment and Plan: Major depressive disorder, recurrent.  Anxiety.  Review current medication and blood work.  Patient like to keep on current medication.  Continue amitriptyline 75 mg at bedtime and Celexa 20 mg daily.  Encouraged to have her husband talk to see a therapist first before seeing a psychiatrist.  Recommended to call us back if she has any question or any concern.  Follow-up in 3 months.  Follow Up Instructions:    I discussed the assessment and treatment plan with the patient. The patient was provided an opportunity to ask questions and all were answered. The patient agreed with the plan and demonstrated an understanding of the instructions.   The patient was advised to call back or seek an in-person evaluation  if the symptoms worsen or if the condition fails to improve as anticipated.  I provided 17 minutes of non-face-to-face time during this encounter.   Cleotis Nipper, MD

## 2021-07-21 ENCOUNTER — Other Ambulatory Visit: Payer: Self-pay | Admitting: Internal Medicine

## 2021-07-21 DIAGNOSIS — Z1231 Encounter for screening mammogram for malignant neoplasm of breast: Secondary | ICD-10-CM

## 2021-07-22 ENCOUNTER — Ambulatory Visit (INDEPENDENT_AMBULATORY_CARE_PROVIDER_SITE_OTHER): Payer: Medicare PPO | Admitting: Nurse Practitioner

## 2021-07-22 ENCOUNTER — Encounter: Payer: Self-pay | Admitting: Nurse Practitioner

## 2021-07-22 ENCOUNTER — Other Ambulatory Visit: Payer: Self-pay

## 2021-07-22 ENCOUNTER — Ambulatory Visit: Payer: Medicare HMO | Admitting: Nurse Practitioner

## 2021-07-22 VITALS — BP 122/76 | Ht 60.0 in | Wt 114.0 lb

## 2021-07-22 DIAGNOSIS — Z01419 Encounter for gynecological examination (general) (routine) without abnormal findings: Secondary | ICD-10-CM | POA: Diagnosis not present

## 2021-07-22 DIAGNOSIS — M8589 Other specified disorders of bone density and structure, multiple sites: Secondary | ICD-10-CM

## 2021-07-22 DIAGNOSIS — Z780 Asymptomatic menopausal state: Secondary | ICD-10-CM

## 2021-07-22 NOTE — Progress Notes (Signed)
   Anna Lindsey [birth year] [id number]   History:  75 y.o. G2P1011 presents for breast and pelvic exam without GYN complaints. Postmenopausal - no HRT, no bleeding. 75 LEEP CIN-1, subsequent paps normal. Mother passed in December at age 43.   Gynecologic History Patient's last menstrual period was 08/06/2000.   Contraception: post menopausal status Sexually active: No  Health Maintenance Last Pap: No longer screening per guidelines Last mammogram: 08/28/2020. Results were: Normal Last colonoscopy: 2014 Last Dexa: 06/29/2018. Results were: T-score -2.0, FRAX 18% / 3.7%  Past medical history, past surgical history, family history and social history were all reviewed and documented in the EPIC chart. Married. Retired. Daughter lives here.   ROS:  A ROS was performed and pertinent positives and negatives are included.  Exam:  Vitals:   74/17/22 1234  BP: 122/76  Weight: 114 lb (51.7 kg)  Height: 5' (1.524 m)   Body mass index is 22.26 kg/m.  General appearance:  Normal Thyroid:  Symmetrical, normal in size, without palpable masses or nodularity. Respiratory  Auscultation:  Clear without wheezing or rhonchi Cardiovascular  Auscultation:  Regular rate, without rubs, murmurs or gallops  Edema/varicosities:  Not grossly evident Abdominal  Soft,nontender, without masses, guarding or rebound.  Liver/spleen:  No organomegaly noted  Hernia:  None appreciated  Skin  Inspection:  Grossly normal Breasts: Examined lying and sitting.   Right: Without masses, retractions, nipple discharge or axillary adenopathy.   Left: Without masses, retractions, nipple discharge or axillary adenopathy. Genitourinary   Inguinal/mons:  Normal without inguinal adenopathy  External genitalia:  Normal appearing vulva with no masses, tenderness, or lesions  BUS/Urethra/Skene's glands:  Normal  Vagina:  Atrophic changes  Cervix:  Normal appearing without discharge or lesions  Uterus:  Normal in size,  shape and contour.  Midline and mobile, nontender  Adnexa/parametria:     Rt: Normal in size, without masses or tenderness.   Lt: Normal in size, without masses or tenderness.  Anus and perineum: Normal  Digital rectal exam: Normal sphincter tone without palpated masses or tenderness  Patient informed chaperone available to be present for breast and pelvic exam. Patient has requested no chaperone to be present. Patient has been advised what will be completed during breast and pelvic exam.   Assessment/Plan:  75 y.o. G2P1011 for breast and pelvic exam.   Well female exam with routine gynecological exam - Education provided on SBEs, importance of preventative screenings, current guidelines, high calcium diet, regular exercise, and multivitamin daily. Labs with PCP.   Postmenopausal - no HRT, no bleeding.   Osteopenia of multiple sites - T-score - 2.0. She is scheduled for Dexa next month. Continue Calcium + Vitamin D supplement and regular exercise.   Screening for cervical cancer - 75 LEEP CIN-1, subsequent paps normal.No longer screening per guidelines.   Screening for breast cancer - Normal mammogram history.  Continue annual screenings.  Normal breast exam today. Mammogram scheduled 08/29/2021.   Screening for colon cancer - 2014 colonoscopy. Will repeat at GI's recommended interval.   Return in 2 years for breast and pelvic exam.   Olivia Mackie DNP, 12:54 PM 07/22/2021

## 2021-08-31 ENCOUNTER — Other Ambulatory Visit: Payer: Self-pay

## 2021-08-31 ENCOUNTER — Ambulatory Visit
Admission: RE | Admit: 2021-08-31 | Discharge: 2021-08-31 | Disposition: A | Discharge status: Home / Self Care | Payer: Medicare PPO | Source: Ambulatory Visit | Attending: *Deleted | Admitting: *Deleted

## 2021-08-31 DIAGNOSIS — Z1231 Encounter for screening mammogram for malignant neoplasm of breast: Secondary | ICD-10-CM

## 2021-10-08 ENCOUNTER — Encounter (HOSPITAL_COMMUNITY): Payer: Self-pay | Admitting: Psychiatry

## 2021-10-08 ENCOUNTER — Telehealth (HOSPITAL_BASED_OUTPATIENT_CLINIC_OR_DEPARTMENT_OTHER): Payer: Medicare PPO | Admitting: Psychiatry

## 2021-10-08 ENCOUNTER — Other Ambulatory Visit: Payer: Self-pay

## 2021-10-08 DIAGNOSIS — F330 Major depressive disorder, recurrent, mild: Secondary | ICD-10-CM

## 2021-10-08 DIAGNOSIS — F419 Anxiety disorder, unspecified: Secondary | ICD-10-CM

## 2021-10-08 MED ORDER — NORTRIPTYLINE HCL 75 MG PO CAPS: 75.0000 mg | ORAL_CAPSULE | Freq: Every day | ORAL | 0 refills | Status: DC

## 2021-10-08 MED ORDER — CITALOPRAM HYDROBROMIDE 20 MG PO TABS: ORAL_TABLET | ORAL | 0 refills | Status: DC

## 2021-10-08 NOTE — Progress Notes (Addendum)
Virtual Visit via Telephone Note  I connected with Anna Lindsey on 10/08/21 at 10:20 AM EDT via telephone and verified that I am speaking with the correct person using two identifiers.  Location: Patient: Home Provider: Home Office   I discussed the limitations of evaluation and management by telemedicine and the availability of in person appointments. The patient expressed understanding and agreed to proceed.  History of Present Illness: Patient is evaluated by phone session.  She is doing okay on her medication.  She is sleeping very good.  She tried to keep herself busy as taking care of multiple cats and also she has a very good network of friends.  She admitted sometimes anxious about her husband who has not seen physician in a while.  She is trying her husband to get evaluated to rule out any Asperger but unsuccessful.  Patient does not want to change the medication.  Her appetite is okay.  Her sleep is good.  She denies any major panic attack, crying spells, feeling of hopelessness or worthlessness.  She denies any suicidal thoughts.  She has no tremors, shakes or any EPS.  Past Psychiatric History; H/O depression since 42s.  No h/o mania, psychosis, suicidal attempt or inpatient. Saw therapist on and off. We tried Lamictal but stopped due to rash.    Psychiatric Specialty Exam: Physical Exam  Review of Systems  Weight 110 lb (49.9 kg), last menstrual period 08/06/2000.There is no height or weight on file to calculate BMI.  General Appearance: NA  Eye Contact:  NA  Speech:  Clear and Coherent  Volume:  Normal  Mood:  Anxious  Affect:  NA  Thought Process:  Goal Directed  Orientation:  Full (Time, Place, and Person)  Thought Content:  WDL  Suicidal Thoughts:  No  Homicidal Thoughts:  No  Memory:  Immediate;   Good Recent;   Good Remote;   Good  Judgement:  Intact  Insight:  Present  Psychomotor Activity:  NA  Concentration:  Concentration: Good and Attention Span: Good   Recall:  Good  Fund of Knowledge:  Good  Language:  Good  Akathisia:  No  Handed:  Right  AIMS (if indicated):     Assets:  Communication Skills Desire for Improvement Housing Resilience Social Support Transportation  ADL's:  Intact  Cognition:  WNL  Sleep:   ok      Assessment and Plan: Major depressive disorder, recurrent.  Anxiety.  Patient is stable on her current medication.  Her symptoms are manageable.  Continue amitriptyline 75 mg at bedtime and Celexa 20 mg daily.  Patient has a good social network.  Her plan is to spend Thanksgiving with her daughter who lives close by.  Recommended to call us back if there is any question or any concern.  Follow-up in 3 months".  Follow Up Instructions:    I discussed the assessment and treatment plan with the patient. The patient was provided an opportunity to ask questions and all were answered. The patient agreed with the plan and demonstrated an understanding of the instructions.   The patient was advised to call back or seek an in-person evaluation if the symptoms worsen or if the condition fails to improve as anticipated.  I provided 20 minutes of non-face-to-face time during this encounter.   Cleotis Nipper, MD

## 2022-01-07 ENCOUNTER — Other Ambulatory Visit: Payer: Self-pay

## 2022-01-07 ENCOUNTER — Encounter (HOSPITAL_COMMUNITY): Payer: Self-pay | Admitting: Psychiatry

## 2022-01-07 ENCOUNTER — Ambulatory Visit (HOSPITAL_BASED_OUTPATIENT_CLINIC_OR_DEPARTMENT_OTHER): Payer: Medicare PPO | Admitting: Psychiatry

## 2022-01-07 DIAGNOSIS — F419 Anxiety disorder, unspecified: Secondary | ICD-10-CM | POA: Diagnosis not present

## 2022-01-07 DIAGNOSIS — F330 Major depressive disorder, recurrent, mild: Secondary | ICD-10-CM

## 2022-01-07 MED ORDER — NORTRIPTYLINE HCL 75 MG PO CAPS: 75.0000 mg | ORAL_CAPSULE | Freq: Every day | ORAL | 0 refills | Status: DC

## 2022-01-07 MED ORDER — CITALOPRAM HYDROBROMIDE 20 MG PO TABS: ORAL_TABLET | ORAL | 0 refills | Status: DC

## 2022-01-07 NOTE — Progress Notes (Signed)
Patient ID: Collier Salina, female   DOB: [birth year], 76 y.o.   MRN: [id number]    Location: Patient: In Person Visit  Provider: Office   History of Present Illness: Patient came for her follow-up appointment.  She is taking her medication as prescribed.  She had a good Christmas.  Recently she find out that her older sister who lives in [place] has diagnosed with Alzheimer.  Patient continues to be very busy cleaning her mother's house.  Patient told she has more than thousand pictures where she is trying to organize.  She has given pictures to the family members.  She is hoping to sell the house and she has a potential buyer but she need to clean the house first.  She denies any major panic attack or any crying spells.  She feels her depression is a stable.  She wants to keep the current medication.  She has no tremors shakes or any EPS.  Occasionally she feels hand shaking but it does not interfere in her daily activities.  She like to keep the amitriptyline and Celexa.  Patient lives with her husband and her daughter live close by.  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  Past Psychiatric History; H/O depression since 68s.  No h/o mania, psychosis, suicidal attempt or inpatient. Saw therapist on and off. We tried Lamictal but stopped due to rash.    Psychiatric Specialty Exam: Physical Exam  Review of Systems  Blood pressure (!) 174/86, pulse 72, temperature 98.9 F (37.2 C), temperature source Oral, resp. rate 18, height 5\' 1"  (1.549 m), weight 116 lb 12.8 oz (53 kg), last menstrual period 08/06/2000, SpO2 98 %.There is no height or weight on file to calculate BMI.  General Appearance: Casual  Eye Contact:  Good  Speech:  Clear and Coherent  Volume:  Normal  Mood:  Euthymic  Affect:  Appropriate  Thought Process:  Goal Directed  Orientation:  Full (Time, Place, and Person)  Thought Content:  Logical  Suicidal Thoughts:  No  Homicidal Thoughts:  No  Memory:   Immediate;   Good Recent;   Good Remote;   Good  Judgement:  Good  Insight:  Present  Psychomotor Activity:  Normal  Concentration:  Concentration: Good and Attention Span: Good  Recall:  Good  Fund of Knowledge:  Good  Language:  Good  Akathisia:  No  Handed:  Right  AIMS (if indicated):     Assets:  Communication Skills Desire for Improvement Housing Resilience Social Support Transportation  ADL's:  Intact  Cognition:  WNL  Sleep:   ok        Assessment and Plan: Major depressive disorder, recurrent.  Anxiety.  Patient is stable on her current medication.  She has no tremor or shakes.  Continue nortriptyline 75 mg at bedtime and Celexa 20 mg daily.  She is hoping to clean her mother's house so she can sell.  Discussed medication side effects and benefits.  Recommended to call us back if is any question or concern. Follow up in 3 months.    Follow Up Instructions:    I discussed the assessment and treatment plan with the patient. The patient was provided an opportunity to ask questions and all were answered. The patient agreed with the plan and demonstrated an understanding of the instructions.     Kathlee Nations, MD

## 2022-01-26 ENCOUNTER — Other Ambulatory Visit: Payer: Self-pay | Admitting: Family Medicine

## 2022-01-26 DIAGNOSIS — Z13820 Encounter for screening for osteoporosis: Secondary | ICD-10-CM

## 2022-01-26 DIAGNOSIS — Z780 Asymptomatic menopausal state: Secondary | ICD-10-CM

## 2022-01-26 DIAGNOSIS — M8580 Other specified disorders of bone density and structure, unspecified site: Secondary | ICD-10-CM

## 2022-03-16 ENCOUNTER — Other Ambulatory Visit: Payer: Medicare PPO

## 2022-04-01 ENCOUNTER — Ambulatory Visit
Admission: RE | Admit: 2022-04-01 | Discharge: 2022-04-01 | Disposition: A | Discharge status: Home / Self Care | Payer: Medicare PPO | Source: Ambulatory Visit | Attending: Family Medicine | Admitting: Family Medicine

## 2022-04-01 DIAGNOSIS — M8580 Other specified disorders of bone density and structure, unspecified site: Secondary | ICD-10-CM

## 2022-04-01 DIAGNOSIS — Z13820 Encounter for screening for osteoporosis: Secondary | ICD-10-CM

## 2022-04-01 DIAGNOSIS — Z780 Asymptomatic menopausal state: Secondary | ICD-10-CM

## 2022-04-06 ENCOUNTER — Telehealth (HOSPITAL_COMMUNITY): Payer: Medicare PPO | Admitting: Psychiatry

## 2022-04-08 ENCOUNTER — Ambulatory Visit (HOSPITAL_BASED_OUTPATIENT_CLINIC_OR_DEPARTMENT_OTHER): Payer: Medicare PPO | Admitting: Psychiatry

## 2022-04-08 ENCOUNTER — Encounter (HOSPITAL_COMMUNITY): Payer: Self-pay | Admitting: Psychiatry

## 2022-04-08 DIAGNOSIS — F330 Major depressive disorder, recurrent, mild: Secondary | ICD-10-CM

## 2022-04-08 DIAGNOSIS — F419 Anxiety disorder, unspecified: Secondary | ICD-10-CM | POA: Diagnosis not present

## 2022-04-08 MED ORDER — CITALOPRAM HYDROBROMIDE 10 MG PO TABS: ORAL_TABLET | ORAL | 0 refills | Status: DC

## 2022-04-08 MED ORDER — NORTRIPTYLINE HCL 75 MG PO CAPS: 75.0000 mg | ORAL_CAPSULE | Freq: Every day | ORAL | 0 refills | Status: DC

## 2022-04-08 NOTE — Progress Notes (Signed)
Patient ID: Anna Lindsey, female   DOB: 07-16-1946, 76 y.o.   MRN: [id number] ? ? ? ?Location: ?Patient: In Person Visit  ?Provider: Office ?  ?History of Present Illness: ?Patient came in for her follow-up appointment.  This is a in person visit.  She is doing well on her medication.  She is relieved that house is clean and now listed in the market.  Patient was able to clean her mother's house.  She was able to keep the photos but other things are disposed to family members.  She is sleeping good.  Recently she has a visit to her physician and now she is taking losartan which is helping her blood pressure.  She noticed since started taking blood pressure medication she is more calm and relaxed.  She denies any crying spells, panic attack, feeling of hopelessness or worthlessness.  She has no tremor or shakes or any EPS however noticed occasionally she has jerky movements.  She is not sure where it is coming from.  She reported it does not interfere in her daily activities.  Recently she had a very good her 76th birthday at [employer] with her daughter, her boyfriend and patient's husband.  She really had a good time.  Her appetite is okay.  Her energy level is good.  She denies drinking or using any illegal substances.  She is concerned about her 75 year old sister who lives in [place] and showing signs of dementia but so far stable. ? ?Past Psychiatric History; ?H/O depression since 30s.  No h/o mania, psychosis, suicidal attempt or inpatient. Saw therapist on and off. We tried Lamictal but stopped due to rash.   ? ? ?Psychiatric Specialty Exam: ?Physical Exam  ?Review of Systems  ?Blood pressure 111/70, pulse 80, temperature 97.9 ?F (36.6 ?C), temperature source Temporal, height 5\' 1"  (1.549 m), weight 116 lb 6.4 oz (52.8 kg), last menstrual period 08/06/2000, SpO2 96 %.Body mass index is 21.99 kg/m?.  ?General Appearance: Casual  ?Eye Contact:  Good  ?Speech:  Clear and Coherent  ?Volume:  Normal  ?Mood:   Euthymic  ?Affect:  Appropriate  ?Thought Process:  Goal Directed  ?Orientation:  Full (Time, Place, and Person)  ?Thought Content:  Logical  ?Suicidal Thoughts:  No  ?Homicidal Thoughts:  No  ?Memory:  Immediate;   Good ?Recent;   Good ?Remote;   Good  ?Judgement:  Good  ?Insight:  Good  ?Psychomotor Activity:  Normal  ?Concentration:  Concentration: Good and Attention Span: Good  ?Recall:  Good  ?Fund of Knowledge:  Good  ?Language:  Good  ?Akathisia:  No  ?Handed:  Right  ?AIMS (if indicated):     ?Assets:  Communication Skills ?Desire for Improvement ?Housing ?Social Support ?Transportation  ?ADL's:  Intact  ?Cognition:  WNL  ?Sleep:   ok  ? ? ? ?Assessment and Plan: ?Major depressive disorder, recurrent.  Anxiety. ? ?Discussed occasional jerky movements.  Recommend to try reducing Celexa 10 mg only since she is already on nortriptyline and recently hypertensive medicine added which is helping her anxiety.  Patient agreed to give a try.  I encouraged to call 10/06/2000 back if she feels symptoms are coming back.  Continue nortriptyline 75 mg at bedtime and reduce Celexa 10 mg daily.  Discussed medication side effects and benefits.  Recommended to call us back if she is any question or any concerns.  Follow-up in 3 months. ? ?Collaboration of Care: Primary Care Provider AEB notes are available in epic  to review ? ?Patient/Guardian was advised Release of Information must be obtained prior to any record release in order to collaborate their care with an outside provider. Patient/Guardian was advised if they have not already done so to contact the registration department to sign all necessary forms in order for [place] to release information regarding their care.  ? ?Consent: Patient/Guardian gives verbal consent for treatment and assignment of benefits for services provided during this visit. Patient/Guardian expressed understanding and agreed to proceed.   ? ? ?Follow Up Instructions: ? ?  ?I discussed the assessment and  treatment plan with the patient. The patient was provided an opportunity to ask questions and all were answered. The patient agreed with the plan and demonstrated an understanding of the instructions. ?  ? ? ?Cleotis Nipper, MD ? ?

## 2022-04-09 ENCOUNTER — Telehealth (HOSPITAL_COMMUNITY): Payer: Self-pay | Admitting: *Deleted

## 2022-04-09 NOTE — Telephone Encounter (Signed)
Pt left VM as requested by you to advise that the Pamelor dose she was on was 100 mg. Prescribed by Dr. [name]. ?

## 2022-04-12 NOTE — Telephone Encounter (Signed)
Dr [name] Psychiatrist or PCP?  ?She needs to lower her Celexa to 10 mg to help jerky movements.  ?

## 2022-04-12 NOTE — Telephone Encounter (Signed)
I don't believe it was Dr. Reece Levy psychiatrist. I will call her an advise that you've reduced Celexa. Thanks.

## 2022-07-08 ENCOUNTER — Encounter (HOSPITAL_COMMUNITY): Payer: Self-pay | Admitting: Psychiatry

## 2022-07-08 ENCOUNTER — Ambulatory Visit (HOSPITAL_BASED_OUTPATIENT_CLINIC_OR_DEPARTMENT_OTHER): Payer: Medicare PPO | Admitting: Psychiatry

## 2022-07-08 DIAGNOSIS — F419 Anxiety disorder, unspecified: Secondary | ICD-10-CM

## 2022-07-08 DIAGNOSIS — F330 Major depressive disorder, recurrent, mild: Secondary | ICD-10-CM | POA: Diagnosis not present

## 2022-07-08 MED ORDER — CITALOPRAM HYDROBROMIDE 10 MG PO TABS: ORAL_TABLET | ORAL | 0 refills | Status: DC

## 2022-07-08 MED ORDER — NORTRIPTYLINE HCL 75 MG PO CAPS: 75.0000 mg | ORAL_CAPSULE | Freq: Every day | ORAL | 0 refills | Status: DC

## 2022-07-08 NOTE — Progress Notes (Signed)
Patient ID: Anna Lindsey, female   DOB: 1946-05-03, 76 y.o.   MRN: [id number]    Location: Patient: In Person Visit  Provider: Office   History of Present Illness: Patient came in person for her follow-up appointment.  We cut down her citalopram on the last visit and taking only 10 mg.  She do not notice worsening of symptoms.  However she do feel overwhelmed because of increased work.  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  Overall she feels things are going okay and symptoms are manageable.  She sleeps good.  She denies any tremors, shakes, rash.  She is active and busy.  She has more water intake and that keeps her fracture.  We have cut down Celexa because she has been doing very well and occasionally she has jerky movements.  She did not notice significant reduction in jerky movements by cutting down the Celexa but she reported it was not noticeable anyway.  She lives with her husband.  Her husband is 20 years younger than her and sometime they have disagreement but it is again manageable.  Her energy level is good.  Her 5 year old sister who lives in [place] has dementia but stable.  Her appetite is okay.  Past Psychiatric History; H/O depression since 5s.  No h/o mania, psychosis, suicidal attempt or inpatient. Saw therapist on and off. We tried Lamictal but stopped due to rash.    Psychiatric Specialty Exam: Physical Exam  Review of Systems  Blood pressure 121/75, pulse 83, resp. rate 18, height 5' 1.5" (1.562 m), weight 116 lb 6.4 oz (52.8 kg), last menstrual period 08/06/2000, SpO2 95 %.Body mass index is 21.64 kg/m.  General Appearance: Casual  Eye Contact:  Good  Speech:  Clear and Coherent  Volume:  Normal  Mood:  Euthymic  Affect:  Appropriate  Thought Process:  Goal Directed  Orientation:  Full (Time, Place, and Person)  Thought Content:  WDL  Suicidal Thoughts:  No  Homicidal Thoughts:  No  Memory:   Immediate;   Good Recent;   Good Remote;   Good  Judgement:  Good  Insight:  Present  Psychomotor Activity:  Normal  Concentration:  Concentration: Good and Attention Span: Good  Recall:  Good  Fund of Knowledge:  Good  Language:  Good  Akathisia:  No  Handed:  Right  AIMS (if indicated):     Assets:  Communication Skills Desire for Improvement Housing Resilience Transportation  ADL's:  Intact  Cognition:  WNL  Sleep:   7-8 hrs     Assessment and Plan:  Major depressive disorder, recurrent.  Anxiety.  Patient is stable by reducing Celexa 10 mg.  We discussed to hold on her current dosage for now and will consider cutting down further citalopram in the future if needed.  She admitted some overwhelmed feeling because of increase work after that setting but hoping once vacation season ends she will go back to previous level of work.  I agree with the plan.  Continue nortriptyline 75 mg at bedtime and Celexa 10 mg daily.  We will consider reducing the dose on her next appointment.  I recommend to call us back if she has any question or any concern.  Follow-up in 3 months.  I spent 20 minutes of time face-to-face and this encounter.  Collaboration of Care: Primary Care Provider AEB notes are available in epic to review  Patient/Guardian was  advised Release of Information must be obtained prior to any record release in order to collaborate their care with an outside provider. Patient/Guardian was advised if they have not already done so to contact the registration department to sign all necessary forms in order for [place] to release information regarding their care.   Consent: Patient/Guardian gives verbal consent for treatment and assignment of benefits for services provided during this visit. Patient/Guardian expressed understanding and agreed to proceed.     Follow Up Instructions:    I discussed the assessment and treatment plan with the patient. The patient was provided an  opportunity to ask questions and all were answered. The patient agreed with the plan and demonstrated an understanding of the instructions.     Cleotis Nipper, MD

## 2022-08-03 ENCOUNTER — Other Ambulatory Visit: Payer: Self-pay | Admitting: Family Medicine

## 2022-08-03 DIAGNOSIS — Z1231 Encounter for screening mammogram for malignant neoplasm of breast: Secondary | ICD-10-CM

## 2022-08-16 ENCOUNTER — Other Ambulatory Visit (HOSPITAL_COMMUNITY): Payer: Self-pay | Admitting: Psychiatry

## 2022-08-16 DIAGNOSIS — F419 Anxiety disorder, unspecified: Secondary | ICD-10-CM

## 2022-08-16 DIAGNOSIS — F330 Major depressive disorder, recurrent, mild: Secondary | ICD-10-CM

## 2022-09-06 ENCOUNTER — Ambulatory Visit: Payer: Medicare PPO

## 2022-09-06 ENCOUNTER — Ambulatory Visit
Admission: RE | Admit: 2022-09-06 | Discharge: 2022-09-06 | Disposition: A | Discharge status: Home / Self Care | Payer: Medicare PPO | Source: Ambulatory Visit | Attending: Family Medicine | Admitting: Family Medicine

## 2022-09-06 DIAGNOSIS — Z1231 Encounter for screening mammogram for malignant neoplasm of breast: Secondary | ICD-10-CM

## 2022-10-07 ENCOUNTER — Encounter (HOSPITAL_COMMUNITY): Payer: Self-pay | Admitting: Psychiatry

## 2022-10-07 ENCOUNTER — Ambulatory Visit (HOSPITAL_BASED_OUTPATIENT_CLINIC_OR_DEPARTMENT_OTHER): Payer: Medicare PPO | Admitting: Psychiatry

## 2022-10-07 DIAGNOSIS — F330 Major depressive disorder, recurrent, mild: Secondary | ICD-10-CM

## 2022-10-07 DIAGNOSIS — F419 Anxiety disorder, unspecified: Secondary | ICD-10-CM | POA: Diagnosis not present

## 2022-10-07 MED ORDER — CITALOPRAM HYDROBROMIDE 10 MG PO TABS: ORAL_TABLET | ORAL | 0 refills | Status: DC

## 2022-10-07 NOTE — Progress Notes (Signed)
Patient ID: Anna Lindsey, female   DOB: Feb 19, 1946, 76 y.o.   MRN: [id number]    Location: Patient: In Person Visit  Provider: Office   History of Present Illness: Patient is evaluated in person.  She came to the office for her appointment.  She is taking Celexa 10 mg daily.  Lately she reported some anxiety related to her 33 year old sister who is in the hospital but may need to transfer to a nursing home.  She is sad about it.  She noticed her blood pressure is increased and recently her physician recommended to increase the dose of the blood pressure medication.  Patient also stress about her husband who does not take care of himself.  However patient sleeps good and denies any crying spells or any feeling of hopelessness or worthlessness.  Her long-term plan is to come off from Celexa but she is not sure if she can cut down further since recently we had cut down from 20 mg to 10 mg.  She had a good support from her daughter.  She had support from her pets and her daughter who also does nail salon business.  She excited about having a Thanksgiving with her daughter.  Her appetite is okay.  Her weight is stable.  She has no tremors, shakes or any EPS.  She denies drinking or using any illegal substances.  Past Psychiatric History; H/O depression since 63s.  No h/o mania, psychosis, suicidal attempt or inpatient. Saw therapist on and off. We tried Lamictal but stopped due to rash.     Psychiatric Specialty Exam: Physical Exam  Review of Systems  Blood pressure (!) 145/77, pulse 82, height 5\' 1"  (1.549 m), weight 116 lb (52.6 kg), last menstrual period 08/06/2000.There is no height or weight on file to calculate BMI.  General Appearance: Casual  Eye Contact:  Good  Speech:  Clear and Coherent and Normal Rate  Volume:  Normal  Mood:  Anxious  Affect:  Constricted  Thought Process:  Goal Directed  Orientation:  Full (Time, Place, and Person)  Thought Content:  Logical  Suicidal Thoughts:   No  Homicidal Thoughts:  No  Memory:  Immediate;   Good Recent;   Good Remote;   Good  Judgement:  Good  Insight:  Present  Psychomotor Activity:  Normal  Concentration:  Concentration: Good and Attention Span: Good  Recall:  Good  Fund of Knowledge:  Good  Language:  Good  Akathisia:  No  Handed:  Right  AIMS (if indicated):     Assets:  Communication Skills Desire for Improvement Housing Resilience Social Support Transportation  ADL's:  Intact  Cognition:  WNL  Sleep:   ok     Assessment and Plan: Major depressive disorder, recurrent.  Anxiety.  Discussed her 46 year old sister who lives in [place] may require 2 nursing home since condition is deteriorating.  We discussed to keep the current dose of Celexa due to increased anxiety and recently blood pressure is increased.  Patient agreed with the plan.  She is also taking nortriptyline 75 mg at bedtime from other provider.  Recommend to discuss with her daughter if she can convince patient's husband to get some help.  Patient believes husband has Asperger.  Reassurance given.  Recommend to call [place] back if she has any question or any concern.  Follow-up in 3 months.    Collaboration of Care: Primary Care Provider AEB notes are available in epic to review  Patient/Guardian was advised Release of Information  must be obtained prior to any record release in order to collaborate their care with an outside provider. Patient/Guardian was advised if they have not already done so to contact the registration department to sign all necessary forms in order for [place] to release information regarding their care.   Consent: Patient/Guardian gives verbal consent for treatment and assignment of benefits for services provided during this visit. Patient/Guardian expressed understanding and agreed to proceed.     Follow Up Instructions:    I discussed the assessment and treatment plan with the patient. The patient was provided an opportunity  to ask questions and all were answered. The patient agreed with the plan and demonstrated an understanding of the instructions.     Kathlee Nations, MD

## 2023-01-06 ENCOUNTER — Ambulatory Visit (HOSPITAL_COMMUNITY): Payer: Medicare PPO | Admitting: Psychiatry

## 2023-01-20 ENCOUNTER — Encounter (HOSPITAL_COMMUNITY): Payer: Self-pay | Admitting: Psychiatry

## 2023-01-20 ENCOUNTER — Ambulatory Visit (HOSPITAL_BASED_OUTPATIENT_CLINIC_OR_DEPARTMENT_OTHER): Payer: PPO | Admitting: Psychiatry

## 2023-01-20 VITALS — BP 136/74 | HR 83 | Ht 61.0 in | Wt 116.0 lb

## 2023-01-20 DIAGNOSIS — F330 Major depressive disorder, recurrent, mild: Secondary | ICD-10-CM | POA: Diagnosis not present

## 2023-01-20 DIAGNOSIS — F411 Generalized anxiety disorder: Secondary | ICD-10-CM

## 2023-01-20 MED ORDER — NORTRIPTYLINE HCL 75 MG PO CAPS: 75.0000 mg | ORAL_CAPSULE | Freq: Every day | ORAL | 1 refills | Status: DC

## 2023-01-20 MED ORDER — CITALOPRAM HYDROBROMIDE 10 MG PO TABS: ORAL_TABLET | ORAL | 1 refills | Status: DC

## 2023-01-20 NOTE — Progress Notes (Signed)
Patient ID: Anna Lindsey, female   DOB: January 08, 1946, 77 y.o.   MRN: [id number]    Location: Patient: In Person Visit  Provider: Office   History of Present Illness: Patient came in for her follow-up appointment.  She is doing well on her medication.  She is still sometime feel anxious and a struggle to clean her own house but denies any crying spells or any feeling of hopelessness or worthlessness.  Her older sister not diagnosed with Alzheimer and may require nursing home.  Patient sleeping good.  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  She had a good support from her daughter.  She denies any suicidal thoughts or homicidal thought.  She does feel sometimes anxious about her general health and recently saw her cardiologist who started amlodipine as additional medication to help the blood pressure.  She is also thinking to start primary care and thinking to join [facility] for senior citizen.  So far patient tolerating medication and reported no tremors, shakes or any EPS.  She liked the current dose of Celexa 10 mg and nortriptyline 75 mg.  She used to take Celexa 20 mg but dose reduced and so far she is doing well.  Her energy level is good.  Her appetite is okay.   Past Psychiatric History; H/O depression since 47s.  No h/o mania, psychosis, suicidal attempt or inpatient. Saw therapist on and off. We tried Lamictal but stopped due to rash.    Psychiatric Specialty Exam: Physical Exam  Review of Systems  Blood pressure 136/74, pulse 83, height 5' 1"$  (1.549 m), weight 116 lb (52.6 kg), last menstrual period 08/06/2000.There is no height or weight on file to calculate BMI.  General Appearance: Casual  Eye Contact:  Good  Speech:  Clear and Coherent  Volume:  Normal  Mood:  Anxious  Affect:  Congruent  Thought Process:  Goal Directed  Orientation:  Full (Time, Place, and Person)  Thought Content:  Logical  Suicidal Thoughts:  No   Homicidal Thoughts:  No  Memory:  Immediate;   Good Recent;   Good Remote;   Good  Judgement:  Good  Insight:  Good  Psychomotor Activity:  Normal  Concentration:  Concentration: Good and Attention Span: Good  Recall:  Good  Fund of Knowledge:  Good  Language:  Good  Akathisia:  No  Handed:  Right  AIMS (if indicated):     Assets:  Communication Skills Desire for Improvement Housing Social Support Talents/Skills Transportation  ADL's:  Intact  Cognition:  WNL  Sleep:   ok       Assessment and Plan: 1. GAD (generalized anxiety disorder) Stable.  Continue Celexa 10 mg daily and nortriptyline 75 mg at bedtime  2. Mild episode of recurrent major depressive disorder (HCC) Stable.  Continue nortriptyline 75 mg at bedtime and Celexa 10 mg daily.  Recommended to call us back if she is any question or any concern.  Follow-up in 4 months.  Discussed her 50 year old sister who lives in [place] may require 2 nursing home since condition is deteriorating.  We discussed to keep the current dose of Celexa due to increased anxiety and recently blood pressure is increased.  Patient agreed with the plan.  She is also taking nortriptyline 75 mg at bedtime from other provider.  Recommend to discuss with her daughter if she can convince patient's husband to get some help.  Patient believes husband has  Asperger.  Reassurance given.  Recommend to call us back if she has any question or any concern.  Follow-up in 3 months.    Collaboration of Care: Primary Care Provider AEB notes are available in epic to review  Patient/Guardian was advised Release of Information must be obtained prior to any record release in order to collaborate their care with an outside provider. Patient/Guardian was advised if they have not already done so to contact the registration department to sign all necessary forms in order for [place] to release information regarding their care.   Consent: Patient/Guardian gives  verbal consent for treatment and assignment of benefits for services provided during this visit. Patient/Guardian expressed understanding and agreed to proceed.     Follow Up Instructions:    I discussed the assessment and treatment plan with the patient. The patient was provided an opportunity to ask questions and all were answered. The patient agreed with the plan and demonstrated an understanding of the instructions.     Kathlee Nations, MD

## 2023-02-25 ENCOUNTER — Other Ambulatory Visit (HOSPITAL_COMMUNITY): Payer: Self-pay

## 2023-04-25 ENCOUNTER — Other Ambulatory Visit (HOSPITAL_COMMUNITY): Payer: Self-pay | Admitting: *Deleted

## 2023-04-25 DIAGNOSIS — F411 Generalized anxiety disorder: Secondary | ICD-10-CM

## 2023-04-25 DIAGNOSIS — F330 Major depressive disorder, recurrent, mild: Secondary | ICD-10-CM

## 2023-04-25 MED ORDER — CITALOPRAM HYDROBROMIDE 10 MG PO TABS: ORAL_TABLET | ORAL | 0 refills | Status: DC

## 2023-05-26 ENCOUNTER — Ambulatory Visit (HOSPITAL_COMMUNITY): Payer: Medicare HMO | Admitting: Psychiatry

## 2023-05-26 ENCOUNTER — Encounter (HOSPITAL_COMMUNITY): Payer: Self-pay | Admitting: Psychiatry

## 2023-05-26 DIAGNOSIS — F411 Generalized anxiety disorder: Secondary | ICD-10-CM | POA: Diagnosis not present

## 2023-05-26 DIAGNOSIS — F330 Major depressive disorder, recurrent, mild: Secondary | ICD-10-CM

## 2023-05-26 MED ORDER — NORTRIPTYLINE HCL 50 MG PO CAPS: 50.0000 mg | ORAL_CAPSULE | Freq: Every day | ORAL | 1 refills | Status: DC

## 2023-05-26 MED ORDER — CITALOPRAM HYDROBROMIDE 10 MG PO TABS: ORAL_TABLET | ORAL | 1 refills | Status: DC

## 2023-05-26 NOTE — Progress Notes (Signed)
BH MD/PA/NP OP Progress Note  05/26/2023 2:13 PM BAYA PRESSLER  MRN:  [id number]  Chief Complaint:  Chief Complaint  Patient presents with   Follow-up   HPI: Patient came to the office for her follow-up appointment.  She recently had a visit to [place] to see her 76 year old sister who has been gradually declining and suffer from Alzheimer's.  She was admitted to the hospital and she had to go to see her.  Patient told it was very difficult because she has not drove long distance in a while.  Patient also reported recently stopped the nortriptyline as per the request from her new primary care.  Patient told a new primary care mention about some new studies that it is not good for the health.  She is taking Celexa.  She reported sometimes struggle with sleep and lately noticed increase nervous and anxious.  She is not sure if anxiety is due to not taking the nortriptyline or have a difficult travel to see the sister.  She sometimes have racing thoughts.  Patient has next appointment with her new PCP in November.  She denies any crying spells or any feeling of hopelessness or worthlessness.  She denies any suicidal thoughts.  Her appetite is okay.  Her weight is stable.  She denies any paranoia or any hallucination.  Visit Diagnosis:    ICD-10-CM   1. GAD (generalized anxiety disorder)  F41.1 citalopram (CELEXA) 10 MG tablet    2. Mild episode of recurrent major depressive disorder (HCC)  F33.0 citalopram (CELEXA) 10 MG tablet    nortriptyline (PAMELOR) 50 MG capsule         Past Psychiatric History; H/O depression since 56s.  No h/o mania, psychosis, suicidal attempt or inpatient. Saw therapist on and off. We tried Lamictal but stopped due to rash.    Past Medical History:  Past Medical History:  Diagnosis Date   Anxiety    Broken ankle    crushed heel 2013   Condyloma    Depression    Dysplasia of cervix, low grade (CIN 1) 1995   LEEP   Fibromyalgia    Fractured rib    H/O:  rheumatic fever    Hepatitis B    with complete resolution- documentation - [facility]    Past Surgical History:  Procedure Laterality Date   BLADDER SURGERY  1980   EXPLORATORY LAPAROTOMY     Due to bladder injury with fall of a horse   HYSTEROSCOPY  2004   D&C Polyp   LEEP     TONSILLECTOMY     childhood    Family Psychiatric History: Reviewed.  Family History:  Family History  Problem Relation Age of Onset   Anxiety disorder Mother    Cancer Mother        skin   Thyroid disease Mother    Heart disease Mother        afib   Depression Father    Colon cancer Father    Hypertension Father    Depression Sister    Kidney disease Maternal Grandmother    Parkinson's disease Maternal Grandfather    Diabetes Paternal Grandmother    Heart disease Paternal Grandfather    Breast cancer Paternal Aunt     Social History:  Social History   Socioeconomic History   Marital status: Married    Spouse name: Not on file   Number of children: Not on file   Years of education: Not on file   Highest  education level: Not on file  Occupational History   Not on file  Tobacco Use   Smoking status: Never   Smokeless tobacco: Never  Vaping Use   Vaping Use: Never used  Substance and Sexual Activity   Alcohol use: No    Alcohol/week: 0.0 standard drinks of alcohol   Drug use: No   Sexual activity: Not Currently    Partners: Male    Birth control/protection: Post-menopausal    Comment: 1st intercourse 77 yo-Fewer than 5 partners  Other Topics Concern   Not on file  Social History Narrative   Exercise stretching once a day for 5 minutes   Social Determinants of Health   Financial Resource Strain: Not on file  Food Insecurity: Not on file  Transportation Needs: Not on file  Physical Activity: Not on file  Stress: Not on file  Social Connections: Not on file    Allergies:  Allergies  Allergen Reactions   Ciprofloxacin Anxiety and Anaphylaxis   Hydrocodone Nausea Only     Metabolic Disorder Labs: No results found for: "HGBA1C", "MPG" No results found for: "PROLACTIN" Lab Results  Component Value Date   CHOL 124 06/19/2019   TRIG 85.0 06/19/2019   HDL 53.70 06/19/2019   CHOLHDL 2 06/19/2019   VLDL 17.0 06/19/2019   LDLCALC 54 06/19/2019   LDLCALC 146 (H) 04/02/2015   Lab Results  Component Value Date   TSH 0.93 06/16/2016   TSH 1.324 10/28/2014    Therapeutic Level Labs: No results found for: "LITHIUM" No results found for: "VALPROATE" No results found for: "CBMZ"  Current Medications: Current Outpatient Medications  Medication Sig Dispense Refill   amLODipine (NORVASC) 2.5 MG tablet Take 2.5 mg by mouth daily.     atorvastatin (LIPITOR) 40 MG tablet Take 40 mg by mouth daily.     Calcium Carbonate-Vitamin D (CALCIUM + D PO) Take 600 mg by mouth daily. + magnezium & zinc     citalopram (CELEXA) 10 MG tablet TAKE 1 TABLET BY MOUTH ATBEDTIME. 30 tablet 0   losartan (COZAAR) 25 MG tablet Take by mouth.     Multiple Vitamin (MULTIVITAMIN) tablet Take 1 tablet by mouth daily.     nortriptyline (PAMELOR) 75 MG capsule Take 1 capsule (75 mg total) by mouth at bedtime. 90 capsule 1   No current facility-administered medications for this visit.     Musculoskeletal: Strength & Muscle Tone: within normal limits Gait & Station: normal Patient leans: N/A  Psychiatric Specialty Exam: Review of Systems  Psychiatric/Behavioral:  The patient is nervous/anxious.     Blood pressure 134/74, pulse 82, height 5' 1.75" (1.568 m), weight 118 lb (53.5 kg), last menstrual period 08/06/2000.There is no height or weight on file to calculate BMI.  General Appearance: Casual  Eye Contact:  Good  Speech:  Clear and Coherent  Volume:  Normal  Mood:  Anxious  Affect:  Appropriate  Thought Process:  Goal Directed  Orientation:  Full (Time, Place, and Person)  Thought Content: WDL   Suicidal Thoughts:  No  Homicidal Thoughts:  No  Memory:  Immediate;    Good Recent;   Good Remote;   Good  Judgement:  Good  Insight:  Good  Psychomotor Activity:  Normal  Concentration:  Concentration: Good and Attention Span: Good  Recall:  Good  Fund of Knowledge: Good  Language: Good  Akathisia:  No  Handed:  Right  AIMS (if indicated): not done  Assets:  Communication Skills Desire for Improvement Housing  Social Support Talents/Skills  ADL's:  Intact  Cognition: WNL  Sleep:  Good   Screenings: PHQ2-9    Flowsheet Row Office Visit from 07/21/2016 in [facility] Care at [facility] Visit from 05/15/2015 in Primary Care at [facility] Visit from 10/28/2014 in Primary Care at [facility] Total Score 0 0 1  PHQ-9 Total Score -- -- 2      Flowsheet Row Office Visit from [facility]/08/2022 in [facility] Office Visit from 01[facility]07/2022 in [facility] Video Visit from 07/02/2021 in [facility]  C-SSRS RISK CATEGORY No Risk No Risk No Risk        Assessment and Plan: I reviewed current medication.  Patient not taking nortriptyline because her primary care physician who she recently started seeing recommend to stop after she discussed some studies that it is not good for the patient.  I am not aware about any studies and I recommend patient to have her primary care doctor call to [place] if there is any concern taking the nortriptyline.  Patient not sure if recent anxiety is due to not taking the medication or recent travel visit to see her sister.  She has a plan to go back to see her again.  Patient admitted that she like to cut down the medication but again not sure what will happen if she stopped the medicine.  I recommend if she like to come down the medicine and then try to cut the dose first rather than stopping the medication.  She agreed with the plan.  We will cut down the nortriptyline to 50 mg and keep the Celexa 10 mg.  I recommend if she  notices worsening of anxiety, depression or not doing very well then she should call us back immediately.  And she will remind her primary care in November 2 contact us or even sooner if she has any further question.  I have provided patient our contact information.  Recommend to call us back if she has any question or any concern.  Follow-up in 6 months.  Collaboration of Care: Collaboration of Care: Other provider involved in patient's care AEB notes are available in epic to review.  Patient/Guardian was advised Release of Information must be obtained prior to any record release in order to collaborate their care with an outside provider. Patient/Guardian was advised if they have not already done so to contact the registration department to sign all necessary forms in order for [place] to release information regarding their care.   Consent: Patient/Guardian gives verbal consent for treatment and assignment of benefits for services provided during this visit. Patient/Guardian expressed understanding and agreed to proceed.    Cleotis Nipper, MD 05/26/2023, 2:13 PM

## 2023-08-25 ENCOUNTER — Other Ambulatory Visit (HOSPITAL_COMMUNITY): Payer: Self-pay | Admitting: *Deleted

## 2023-08-25 MED ORDER — CITALOPRAM HYDROBROMIDE 10 MG PO TABS: 10.0000 mg | ORAL_TABLET | Freq: Every day | ORAL | 2 refills | Status: DC

## 2023-08-26 ENCOUNTER — Other Ambulatory Visit (HOSPITAL_COMMUNITY): Payer: Self-pay | Admitting: *Deleted

## 2023-08-26 MED ORDER — CITALOPRAM HYDROBROMIDE 10 MG PO TABS: 10.0000 mg | ORAL_TABLET | Freq: Every day | ORAL | 2 refills | Status: DC

## 2023-11-24 ENCOUNTER — Ambulatory Visit (HOSPITAL_COMMUNITY): Payer: Medicare HMO | Admitting: Psychiatry

## 2023-11-24 ENCOUNTER — Encounter (HOSPITAL_COMMUNITY): Payer: Self-pay | Admitting: Psychiatry

## 2023-11-24 VITALS — Resp 18 | Ht 63.0 in | Wt 119.0 lb

## 2023-11-24 DIAGNOSIS — F411 Generalized anxiety disorder: Secondary | ICD-10-CM

## 2023-11-24 DIAGNOSIS — F330 Major depressive disorder, recurrent, mild: Secondary | ICD-10-CM

## 2023-11-24 MED ORDER — CITALOPRAM HYDROBROMIDE 10 MG PO TABS: 10.0000 mg | ORAL_TABLET | Freq: Every day | ORAL | 1 refills | Status: DC

## 2023-11-24 MED ORDER — NORTRIPTYLINE HCL 50 MG PO CAPS
50.0000 mg | ORAL_CAPSULE | Freq: Every day | ORAL | 1 refills | Status: DC
Start: 2023-11-24 — End: 2024-05-17

## 2023-11-24 NOTE — Progress Notes (Signed)
BH MD/PA/NP OP Progress Note  Patient location; office Provider location; office  11/24/2023 12:22 PM Anna Lindsey  MRN:  [id number]  Chief Complaint:  Chief Complaint  Patient presents with   Follow-up   Anxiety   HPI: Patient came for her follow-up appointment.  She reported her 76 year old sister October.  Patient told sister was gradually declining in her memory and it was expected.  She told but will happen in [place].  She also told there is a celebration of life in [place] but she has no plan to go time.  She reported some anxiety and nervousness.  Her daughter had a shoulder surgery and she was busy taking care of her daughter but relief as recently daughter cannot drive.  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  She admitted to keeping herself busy is helpful.  She denies any insomnia, panic attack, crying spells or any feeling of hopelessness or worthlessness.  Since she is back on nortriptyline her sleep and anxiety is somewhat better.  Today her blood pressure was slightly high.  When I reviewed her medication she realized that not taking amlodipine and losartan.  Her cardiologist is Dr. Sharyn Lull.  She still have no primary care but now going to start [facility] medical services.  She denies any hallucination, paranoia, suicidal thoughts.  She has no tremor or shakes or any EPS.  She denies any major panic attack.  Visit Diagnosis:    ICD-10-CM   1. Mild episode of recurrent major depressive disorder (HCC)  F33.0 citalopram (CELEXA) 10 MG tablet    nortriptyline (PAMELOR) 50 MG capsule    2. GAD (generalized anxiety disorder)  F41.1 citalopram (CELEXA) 10 MG tablet      Past Psychiatric History: Reviewed. H/O depression since 78s.  No h/o mania, psychosis, suicidal attempt or inpatient. Saw therapist on and off. We tried Lamictal but stopped due to rash.     Past Medical History:  Past Medical History:  Diagnosis Date    Anxiety    Broken ankle    crushed heel 2013   Condyloma    Depression    Dysplasia of cervix, low grade (CIN 1) 1995   LEEP   Fibromyalgia    Fractured rib    H/O: rheumatic fever    Hepatitis B    with complete resolution- documentation - [facility]    Past Surgical History:  Procedure Laterality Date   BLADDER SURGERY  1980   EXPLORATORY LAPAROTOMY     Due to bladder injury with fall of a horse   HYSTEROSCOPY  2004   D&C Polyp   LEEP     TONSILLECTOMY     childhood    Family Psychiatric History: Reviewed.  Family History:  Family History  Problem Relation Age of Onset   Anxiety disorder Mother    Cancer Mother        skin   Thyroid disease Mother    Heart disease Mother        afib   Depression Father    Colon cancer Father    Hypertension Father    Depression Sister    Kidney disease Maternal Grandmother    Parkinson's disease Maternal Grandfather    Diabetes Paternal Grandmother    Heart disease Paternal Grandfather    Breast cancer Paternal Aunt     Social History:  Social History   Socioeconomic History   Marital status: Married  Spouse name: Not on file   Number of children: Not on file   Years of education: Not on file   Highest education level: Not on file  Occupational History   Not on file  Tobacco Use   Smoking status: Never   Smokeless tobacco: Never  Vaping Use   Vaping status: Never Used  Substance and Sexual Activity   Alcohol use: No    Alcohol/week: 0.0 standard drinks of alcohol   Drug use: No   Sexual activity: Not Currently    Partners: Male    Birth control/protection: Post-menopausal    Comment: 1st intercourse 77 yo-Fewer than 5 partners  Other Topics Concern   Not on file  Social History Narrative   Exercise stretching once a day for 5 minutes   Social Drivers of Corporate investment banker Strain: Not on file  Food Insecurity: Not on file  Transportation Needs: Not on file  Physical Activity: Not on file  Stress:  Not on file  Social Connections: Not on file    Allergies:  Allergies  Allergen Reactions   Ciprofloxacin Anxiety and Anaphylaxis   Hydrocodone Nausea Only    Metabolic Disorder Labs: No results found for: "HGBA1C", "MPG" No results found for: "PROLACTIN" Lab Results  Component Value Date   CHOL 124 06/19/2019   TRIG 85.0 06/19/2019   HDL 53.70 06/19/2019   CHOLHDL 2 06/19/2019   VLDL 17.0 06/19/2019   LDLCALC 54 06/19/2019   LDLCALC 146 (H) 04/02/2015   Lab Results  Component Value Date   TSH 0.93 06/16/2016   TSH 1.324 10/28/2014    Therapeutic Level Labs: No results found for: "LITHIUM" No results found for: "VALPROATE" No results found for: "CBMZ"  Current Medications: Current Outpatient Medications  Medication Sig Dispense Refill   atorvastatin (LIPITOR) 40 MG tablet Take 40 mg by mouth daily.     citalopram (CELEXA) 10 MG tablet Take 1 tablet (10 mg total) by mouth daily. 30 tablet 2   cyanocobalamin (VITAMIN B12) 1000 MCG tablet Take 1,000 mcg by mouth daily.     Multiple Vitamin (MULTIVITAMIN) tablet Take 1 tablet by mouth daily.     nortriptyline (PAMELOR) 50 MG capsule Take 1 capsule (50 mg total) by mouth at bedtime. 90 capsule 1   amLODipine (NORVASC) 2.5 MG tablet Take 2.5 mg by mouth daily. (Patient not taking: Reported on 11/24/2023)     Calcium Carbonate-Vitamin D (CALCIUM + D PO) Take 600 mg by mouth daily. + magnezium & zinc     losartan (COZAAR) 25 MG tablet Take by mouth. (Patient not taking: Reported on 11/24/2023)     No current facility-administered medications for this visit.     Musculoskeletal: Strength & Muscle Tone: within normal limits Gait & Station: normal Patient leans: N/A  Psychiatric Specialty Exam: Review of Systems  Blood pressure (!) 159/88, pulse 85, resp. rate 18, height 5\' 3"  (1.6 m), weight 119 lb (54 kg), last menstrual period 08/06/2000.Body mass index is 21.08 kg/m.  General Appearance: Casual  Eye Contact:   Good  Speech:  Clear and Coherent and Slow  Volume:  Normal  Mood:  Anxious  Affect:  Appropriate  Thought Process:  Goal Directed  Orientation:  Full (Time, Place, and Person)  Thought Content: Rumination   Suicidal Thoughts:  No  Homicidal Thoughts:  No  Memory:  Immediate;   Good Recent;   Good Remote;   Good  Judgement:  Intact  Insight:  Present  Psychomotor Activity:  Normal  Concentration:  Concentration: Good and Attention Span: Good  Recall:  Good  Fund of Knowledge: Good  Language: Good  Akathisia:  No  Handed:  Right  AIMS (if indicated): not done  Assets:  Communication Skills Desire for Improvement Housing Social Support Talents/Skills Transportation  ADL's:  Intact  Cognition: WNL  Sleep:  Good   Screenings: PHQ2-9    Flowsheet Row Office Visit from 07/21/2016 in Primary Care at [place] Office Visit from 05/15/2015 in Primary Care at [facility] Visit from 10/28/2014 in Primary Care at [facility] Total Score 0 0 1  PHQ-9 Total Score -- -- 2      Flowsheet Row [facility] Office Visit from 01/07/2022 in [facility] Video Visit from 07/02/2021 in [facility]  C-SSRS RISK CATEGORY No Risk No Risk No Risk        Assessment and Plan: Discussed recent loss of sister and condolences provided.  I reviewed medication.  She is taking nortriptyline which is helping her anxiety however her blood pressure is slightly increased.  She admitted not taking the blood pressure medication as prescribed and realize that could be the reason for blood pressure is high today.  She promised to go back to check her blood pressure at home and medication pillbox.  Overall she feels better.  She tried to keep herself busy.  She is taking Celexa 10 mg daily and nortriptyline 50 mg at bedtime.  Encouraged to keep appointment with the  cardiology.  Recommended to call us back if she has any question or any concern.  Provided brief psychotherapy about her anxiety and recommend to use breathing technique, walking, listening music for nervousness.  Patient like to keep the appointment in 6 months.  I encouraged to call us back if she ointment.  Collaboration of Care: Collaboration of Care: Other provider involved in patient's care AEB notes are available in epic to review  Patient/Guardian was advised Release of Information must be obtained prior to any record release in order to collaborate their care with an outside provider. Patient/Guardian was advised if they have not already done so to contact the registration department to sign all necessary forms in order for [place] to release information regarding their care.   Consent: Patient/Guardian gives verbal consent for treatment and assignment of benefits for services provided during this visit. Patient/Guardian expressed understanding and agreed to proceed.   I provided 26 minutes face-to-face time during this encounter.  Cleotis Nipper, MD 11/24/2023, 12:22 PM

## 2024-04-24 ENCOUNTER — Other Ambulatory Visit: Payer: Self-pay | Admitting: Internal Medicine

## 2024-04-24 DIAGNOSIS — Z1231 Encounter for screening mammogram for malignant neoplasm of breast: Secondary | ICD-10-CM

## 2024-05-08 ENCOUNTER — Ambulatory Visit

## 2024-05-08 ENCOUNTER — Ambulatory Visit
Admission: RE | Admit: 2024-05-08 | Discharge: 2024-05-08 | Disposition: A | Discharge status: Home / Self Care | Source: Ambulatory Visit | Attending: Internal Medicine | Admitting: Internal Medicine

## 2024-05-08 DIAGNOSIS — Z1231 Encounter for screening mammogram for malignant neoplasm of breast: Secondary | ICD-10-CM

## 2024-05-16 ENCOUNTER — Ambulatory Visit (INDEPENDENT_AMBULATORY_CARE_PROVIDER_SITE_OTHER): Admitting: Podiatry

## 2024-05-16 DIAGNOSIS — L989 Disorder of the skin and subcutaneous tissue, unspecified: Secondary | ICD-10-CM

## 2024-05-16 NOTE — Progress Notes (Signed)
  Subjective:  Patient ID: Anna Lindsey, female    DOB: May 28, 1946,  MRN: [id number]  Chief Complaint  Patient presents with   Callouses    78 y.o. female presents with the above complaint.  Patient presents with left heel porokeratotic lesion with central nucleated, painful to touch is progressive and worsens with ambulation and shoe pressure she wanted to discuss treatment options for has not seen him as bothersome he denies any other acute issues.   Review of Systems: Negative except as noted in the HPI. Denies N/V/F/Ch.  Past Medical History:  Diagnosis Date   Anxiety    Broken ankle    crushed heel 2013   Condyloma    Depression    Dysplasia of cervix, low grade (CIN 1) 1995   LEEP   Fibromyalgia    Fractured rib    H/O: rheumatic fever    Hepatitis B    with complete resolution- documentation - [facility]    Current Outpatient Medications:    amLODipine (NORVASC) 2.5 MG tablet, Take 2.5 mg by mouth daily. (Patient not taking: Reported on 11/24/2023), Disp: , Rfl:    atorvastatin  (LIPITOR) 40 MG tablet, Take 40 mg by mouth daily., Disp: , Rfl:    Calcium  Carbonate-Vitamin D  (CALCIUM  + D PO), Take 600 mg by mouth daily. + magnezium & zinc, Disp: , Rfl:    citalopram  (CELEXA ) 10 MG tablet, Take 1 tablet (10 mg total) by mouth daily., Disp: 90 tablet, Rfl: 1   cyanocobalamin (VITAMIN B12) 1000 MCG tablet, Take 1,000 mcg by mouth daily., Disp: , Rfl:    losartan (COZAAR) 25 MG tablet, Take by mouth. (Patient not taking: Reported on 11/24/2023), Disp: , Rfl:    Multiple Vitamin (MULTIVITAMIN) tablet, Take 1 tablet by mouth daily., Disp: , Rfl:    nortriptyline  (PAMELOR ) 50 MG capsule, Take 1 capsule (50 mg total) by mouth at bedtime., Disp: 90 capsule, Rfl: 1  Social History   Tobacco Use  Smoking Status Never  Smokeless Tobacco Never    Allergies  Allergen Reactions   Ciprofloxacin Anxiety and Anaphylaxis   Hydrocodone  Nausea Only   Objective:  There were no vitals filed  for this visit. There is no height or weight on file to calculate BMI. Constitutional Well developed. Well nourished.  Vascular Dorsalis pedis pulses palpable bilaterally. Posterior tibial pulses palpable bilaterally. Capillary refill normal to all digits.  No cyanosis or clubbing noted. Pedal hair growth normal.  Neurologic Normal speech. Oriented to person, place, and time. Epicritic sensation to light touch grossly present bilaterally.  Dermatologic Left heel porokeratotic lesion with central nucleated core noted.  No pain on palpation.  Orthopedic: Normal joint ROM without pain or crepitus bilaterally. No visible deformities. No bony tenderness.   Radiographs: None Assessment:   1. Benign skin lesion    Plan:  Patient was evaluated and treated and all questions answered.  Left heel porokeratosis/benign skin lesion  - All questions and concerns were discussed with the patient extensive detail - Given the amount of porokeratotic lesions present recommend second debridement of the lesion using chisel blade handle the lesion was debrided under additional tissue patient with no painful bleeding noted - Shoegear modification discussed  No follow-ups on file.

## 2024-05-17 ENCOUNTER — Encounter (HOSPITAL_COMMUNITY): Payer: Self-pay | Admitting: Psychiatry

## 2024-05-17 ENCOUNTER — Ambulatory Visit (HOSPITAL_COMMUNITY): Payer: Medicare HMO | Admitting: Psychiatry

## 2024-05-17 ENCOUNTER — Other Ambulatory Visit: Payer: Self-pay

## 2024-05-17 DIAGNOSIS — F411 Generalized anxiety disorder: Secondary | ICD-10-CM

## 2024-05-17 DIAGNOSIS — F330 Major depressive disorder, recurrent, mild: Secondary | ICD-10-CM | POA: Diagnosis not present

## 2024-05-17 MED ORDER — NORTRIPTYLINE HCL 50 MG PO CAPS: 50.0000 mg | ORAL_CAPSULE | Freq: Every day | ORAL | 1 refills | Status: DC

## 2024-05-17 MED ORDER — CITALOPRAM HYDROBROMIDE 10 MG PO TABS: 10.0000 mg | ORAL_TABLET | Freq: Every day | ORAL | 1 refills | Status: DC

## 2024-05-17 NOTE — Progress Notes (Signed)
 BH MD/PA/NP OP Progress Note  Patient location; office Provider location; office  05/17/2024 3:08 PM KAZI MONTORO  MRN:  [id number]  Chief Complaint:  Chief Complaint  Patient presents with   Follow-up   Medication Refill   HPI: Patient came for her follow-up appointment.  She reported things are okay however sad because her 77 year old brother-in-law died recently.  Patient told her sister died in 09-23-2024 and she noticed brother-in-law not doing very well and he died soon after.  Patient told her niece and nephew came for the funeral.  They live out of town.  Patient reported anxiety is under control and she does not feel overwhelmed or anxious.  She is happy that her daughter is doing much better and started her own mobile transportation business and takes patient to the doctor's office.  Patient lives with her husband who is 10 years younger than her.  She feels sometime that husband could be more supportive and helpful but realized that she cannot change him at this time.  Patient is able to take care of animals.  She has 14 cats and she also take care of her dog and cat.  She denies any crying spells or any feeling of hopelessness or worthlessness.  She sleeps good.  She is taking nortriptyline  and Celexa  which is helping her anxiety and nervousness.  She is back on blood pressure medicine but only taking losartan 50 mg.  She is no longer taking amlodipine.  She is also compliant with atorvastatin .  Her appetite is okay.  Her weight is stable.  She has not seen PCP in a while however sees cardiologist Dr. Glena Landau on regular basis.  She denies any hallucination or paranoia.  She must keep the current medication as prescribed.  She has no tremor or shakes or any EPS.   Visit Diagnosis:    ICD-10-CM   1. Mild episode of recurrent major depressive disorder (HCC)  F33.0 citalopram  (CELEXA ) 10 MG tablet    nortriptyline  (PAMELOR ) 50 MG capsule    2. GAD (generalized anxiety disorder)  F41.1  citalopram  (CELEXA ) 10 MG tablet       Past Psychiatric History: Reviewed. H/O depression since 63s.  No h/o mania, psychosis, suicidal attempt or inpatient. Saw therapist on and off. We tried Lamictal but stopped due to rash.     Past Medical History:  Past Medical History:  Diagnosis Date   Anxiety    Broken ankle    crushed heel 2013   Condyloma    Depression    Dysplasia of cervix, low grade (CIN 1) 1995   LEEP   Fibromyalgia    Fractured rib    H/O: rheumatic fever    Hepatitis B    with complete resolution- documentation - [facility]    Past Surgical History:  Procedure Laterality Date   BLADDER SURGERY  1980   EXPLORATORY LAPAROTOMY     Due to bladder injury with fall of a horse   HYSTEROSCOPY  2004   D&C Polyp   LEEP     TONSILLECTOMY     childhood    Family Psychiatric History: Reviewed.  Family History:  Family History  Problem Relation Age of Onset   Anxiety disorder Mother    Cancer Mother        skin   Thyroid  disease Mother    Heart disease Mother        afib   Depression Father    Colon cancer Father    Hypertension  Father    Depression Sister    Kidney disease Maternal Grandmother    Parkinson's disease Maternal Grandfather    Diabetes Paternal Grandmother    Heart disease Paternal Grandfather    Breast cancer Paternal Aunt     Social History:  Social History   Socioeconomic History   Marital status: Married    Spouse name: Not on file   Number of children: Not on file   Years of education: Not on file   Highest education level: Not on file  Occupational History   Not on file  Tobacco Use   Smoking status: Never   Smokeless tobacco: Never  Vaping Use   Vaping status: Never Used  Substance and Sexual Activity   Alcohol use: No    Alcohol/week: 0.0 standard drinks of alcohol   Drug use: No   Sexual activity: Not Currently    Partners: Male    Birth control/protection: Post-menopausal    Comment: 1st intercourse 78 yo-Fewer than  5 partners  Other Topics Concern   Not on file  Social History Narrative   Exercise stretching once a day for 5 minutes   Social Drivers of Corporate investment banker Strain: Not on file  Food Insecurity: Not on file  Transportation Needs: Not on file  Physical Activity: Not on file  Stress: Not on file  Social Connections: Not on file    Allergies:  Allergies  Allergen Reactions   Ciprofloxacin Anxiety and Anaphylaxis   Hydrocodone  Nausea Only    Metabolic Disorder Labs: No results found for: HGBA1C, MPG No results found for: PROLACTIN Lab Results  Component Value Date   CHOL 124 06/19/2019   TRIG 85.0 06/19/2019   HDL 53.70 06/19/2019   CHOLHDL 2 06/19/2019   VLDL 17.0 06/19/2019   LDLCALC 54 06/19/2019   LDLCALC 146 (H) 04/02/2015   Lab Results  Component Value Date   TSH 0.93 06/16/2016   TSH 1.324 10/28/2014    Therapeutic Level Labs: No results found for: LITHIUM No results found for: VALPROATE No results found for: CBMZ  Current Medications: Current Outpatient Medications  Medication Sig Dispense Refill   atorvastatin  (LIPITOR) 40 MG tablet Take 40 mg by mouth daily.     amLODipine (NORVASC) 2.5 MG tablet Take 2.5 mg by mouth daily. (Patient not taking: Reported on 05/17/2024)     Calcium  Carbonate-Vitamin D  (CALCIUM  + D PO) Take 600 mg by mouth daily. + magnezium & zinc     citalopram  (CELEXA ) 10 MG tablet Take 1 tablet (10 mg total) by mouth daily. 90 tablet 1   cyanocobalamin (VITAMIN B12) 1000 MCG tablet Take 1,000 mcg by mouth daily.     losartan (COZAAR) 25 MG tablet Take by mouth. (Patient not taking: Reported on 11/24/2023)     Multiple Vitamin (MULTIVITAMIN) tablet Take 1 tablet by mouth daily.     nortriptyline  (PAMELOR ) 50 MG capsule Take 1 capsule (50 mg total) by mouth at bedtime. 90 capsule 1   No current facility-administered medications for this visit.     Musculoskeletal: Strength & Muscle Tone: within normal  limits Gait & Station: normal Patient leans: N/A  Psychiatric Specialty Exam: Review of Systems  Blood pressure (!) 156/80, pulse 90, height 5' 1.75 (1.568 m), weight 118 lb (53.5 kg), last menstrual period 08/06/2000.Body mass index is 21.76 kg/m.  General Appearance: Casual  Eye Contact:  Good  Speech:  Clear and Coherent  Volume:  Normal  Mood:  Euthymic  Affect:  Appropriate  Thought Process:  Goal Directed  Orientation:  Full (Time, Place, and Person)  Thought Content: Logical   Suicidal Thoughts:  No  Homicidal Thoughts:  No  Memory:  Immediate;   Good Recent;   Good Remote;   Good  Judgement:  Intact  Insight:  Present  Psychomotor Activity:  Normal  Concentration:  Concentration: Good and Attention Span: Good  Recall:  Good  Fund of Knowledge: Good  Language: Good  Akathisia:  No  Handed:  Right  AIMS (if indicated): not done  Assets:  Communication Skills Desire for Improvement Housing Social Support Talents/Skills Transportation  ADL's:  Intact  Cognition: WNL  Sleep:  Good   Screenings: PHQ2-9    Flowsheet Row Office Visit from 07/21/2016 in Primary Care at [place] Office Visit from 05/15/2015 in Primary Care at [facility] Visit from 10/28/2014 in Primary Care at [place]  PHQ-2 Total Score 0 0 1  PHQ-9 Total Score -- -- 2   Flowsheet Row Office Visit from 04/08/2022 in [facility] Office Visit from 01/07/2022 in [facility] Video Visit from 07/02/2021 in [facility]  C-SSRS RISK CATEGORY No Risk No Risk No Risk     Assessment and Plan: Patient is stable on her current medication.  She is no longer taking more pets to take care of because she realized cannot handle more than what she had.  She is taking blood pressure medicine.  Today her blood pressure is slightly high but usually her blood pressure readings are better.  Continue Celexa  10  mg daily and nortriptyline  50 mg at bedtime.  I encourage blood pressure readings remain high and should consult cardiology.  Encourage walking, exercising.  Recommend to call us  back if she has any question or any concern.  Follow-up in 6 months.   Collaboration of Care: Collaboration of Care: Other provider involved in patient's care AEB notes are available in epic to review  Patient/Guardian was advised Release of Information must be obtained prior to any record release in order to collaborate their care with an outside provider. Patient/Guardian was advised if they have not already done so to contact the registration department to sign all necessary forms in order for us  to release information regarding their care.   Consent: Patient/Guardian gives verbal consent for treatment and assignment of benefits for services provided during this visit. Patient/Guardian expressed understanding and agreed to proceed.   I provided 16 minutes face-to-face time during this encounter.  Arturo Late, MD 05/17/2024, 3:08 PM

## 2024-05-24 ENCOUNTER — Ambulatory Visit (HOSPITAL_COMMUNITY): Payer: Medicare HMO | Admitting: Psychiatry

## 2024-11-15 ENCOUNTER — Encounter (HOSPITAL_COMMUNITY): Payer: Self-pay | Admitting: Psychiatry

## 2024-11-15 ENCOUNTER — Ambulatory Visit (HOSPITAL_COMMUNITY): Admitting: Psychiatry

## 2024-11-15 ENCOUNTER — Other Ambulatory Visit: Payer: Self-pay

## 2024-11-15 DIAGNOSIS — F411 Generalized anxiety disorder: Secondary | ICD-10-CM | POA: Diagnosis not present

## 2024-11-15 DIAGNOSIS — F330 Major depressive disorder, recurrent, mild: Secondary | ICD-10-CM | POA: Diagnosis not present

## 2024-11-15 MED ORDER — CITALOPRAM HYDROBROMIDE 10 MG PO TABS: 10.0000 mg | ORAL_TABLET | Freq: Every day | ORAL | 1 refills | Status: AC
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
## 2025-03-21 ENCOUNTER — Ambulatory Visit (HOSPITAL_COMMUNITY): Admitting: Psychiatry
